# Patient Record
Sex: Female | Born: 1960 | Race: White | Hispanic: No | Smoking: Never smoker
Health system: Southern US, Community
[De-identification: ages and names within clinical notes are randomized; demographics above are authoritative.]

## PROBLEM LIST (undated history)

## (undated) DIAGNOSIS — E785 Hyperlipidemia, unspecified: Secondary | ICD-10-CM

## (undated) DIAGNOSIS — M810 Age-related osteoporosis without current pathological fracture: Secondary | ICD-10-CM

## (undated) DIAGNOSIS — N760 Acute vaginitis: Secondary | ICD-10-CM

## (undated) DIAGNOSIS — M1990 Unspecified osteoarthritis, unspecified site: Secondary | ICD-10-CM

## (undated) DIAGNOSIS — M542 Cervicalgia: Secondary | ICD-10-CM

## (undated) DIAGNOSIS — K5792 Diverticulitis of intestine, part unspecified, without perforation or abscess without bleeding: Secondary | ICD-10-CM

## (undated) DIAGNOSIS — I10 Essential (primary) hypertension: Secondary | ICD-10-CM

## (undated) DIAGNOSIS — M549 Dorsalgia, unspecified: Secondary | ICD-10-CM

## (undated) DIAGNOSIS — B019 Varicella without complication: Secondary | ICD-10-CM

## (undated) DIAGNOSIS — N83209 Unspecified ovarian cyst, unspecified side: Secondary | ICD-10-CM

## (undated) DIAGNOSIS — N926 Irregular menstruation, unspecified: Secondary | ICD-10-CM

## (undated) DIAGNOSIS — N390 Urinary tract infection, site not specified: Secondary | ICD-10-CM

## (undated) DIAGNOSIS — T7840XA Allergy, unspecified, initial encounter: Secondary | ICD-10-CM

## (undated) DIAGNOSIS — N200 Calculus of kidney: Secondary | ICD-10-CM

## (undated) DIAGNOSIS — K219 Gastro-esophageal reflux disease without esophagitis: Secondary | ICD-10-CM

## (undated) DIAGNOSIS — B9689 Other specified bacterial agents as the cause of diseases classified elsewhere: Secondary | ICD-10-CM

## (undated) DIAGNOSIS — U071 COVID-19: Secondary | ICD-10-CM

## (undated) DIAGNOSIS — K635 Polyp of colon: Secondary | ICD-10-CM

## (undated) DIAGNOSIS — D219 Benign neoplasm of connective and other soft tissue, unspecified: Secondary | ICD-10-CM

## (undated) HISTORY — DX: Hyperlipidemia, unspecified: E78.5

## (undated) HISTORY — PX: COLONOSCOPY W/ POLYPECTOMY: SHX1380

## (undated) HISTORY — PX: OTHER SURGICAL HISTORY: SHX169

## (undated) HISTORY — DX: Unspecified osteoarthritis, unspecified site: M19.90

## (undated) HISTORY — PX: COLONOSCOPY: SHX174

## (undated) HISTORY — PX: APPENDECTOMY: SHX54

## (undated) HISTORY — DX: Cervicalgia: M54.2

## (undated) HISTORY — DX: Irregular menstruation, unspecified: N92.6

## (undated) HISTORY — DX: Unspecified ovarian cyst, unspecified side: N83.209

## (undated) HISTORY — PX: WISDOM TOOTH EXTRACTION: SHX21

## (undated) HISTORY — PX: TONSILLECTOMY: SUR1361

## (undated) HISTORY — PX: DILATION AND CURETTAGE OF UTERUS: SHX78

## (undated) HISTORY — DX: Essential (primary) hypertension: I10

## (undated) HISTORY — PX: BREAST SURGERY: SHX581

## (undated) HISTORY — DX: Other specified bacterial agents as the cause of diseases classified elsewhere: N76.0

## (undated) HISTORY — DX: Dorsalgia, unspecified: M54.9

## (undated) HISTORY — DX: Benign neoplasm of connective and other soft tissue, unspecified: D21.9

## (undated) HISTORY — DX: Other specified bacterial agents as the cause of diseases classified elsewhere: B96.89

## (undated) HISTORY — DX: Diverticulitis of intestine, part unspecified, without perforation or abscess without bleeding: K57.92

## (undated) HISTORY — DX: Calculus of kidney: N20.0

## (undated) HISTORY — DX: Gastro-esophageal reflux disease without esophagitis: K21.9

## (undated) HISTORY — DX: Polyp of colon: K63.5

## (undated) HISTORY — DX: Urinary tract infection, site not specified: N39.0

## (undated) HISTORY — DX: COVID-19: U07.1

## (undated) HISTORY — DX: Varicella without complication: B01.9

## (undated) HISTORY — DX: Allergy, unspecified, initial encounter: T78.40XA

## (undated) HISTORY — DX: Age-related osteoporosis without current pathological fracture: M81.0

---

## 2013-07-30 ENCOUNTER — Emergency Department: Payer: Self-pay | Admitting: Emergency Medicine

## 2013-07-30 LAB — CBC WITH DIFFERENTIAL/PLATELET
Basophil #: 0.1 10*3/uL (ref 0.0–0.1)
Basophil %: 1.4 %
Eosinophil #: 0.2 10*3/uL (ref 0.0–0.7)
Eosinophil %: 3.1 %
HCT: 46.1 % (ref 35.0–47.0)
HGB: 15.2 g/dL (ref 12.0–16.0)
Lymphocyte #: 1.7 10*3/uL (ref 1.0–3.6)
Lymphocyte %: 32.5 %
MCH: 31.2 pg (ref 26.0–34.0)
MCHC: 33 g/dL (ref 32.0–36.0)
MCV: 95 fL (ref 80–100)
Monocyte #: 0.7 x10 3/mm (ref 0.2–0.9)
Monocyte %: 14 %
Neutrophil #: 2.6 10*3/uL (ref 1.4–6.5)
Neutrophil %: 49 %
Platelet: 293 10*3/uL (ref 150–440)
RBC: 4.86 10*6/uL (ref 3.80–5.20)
RDW: 13.1 % (ref 11.5–14.5)
WBC: 5.2 10*3/uL (ref 3.6–11.0)

## 2013-07-30 LAB — URINALYSIS, COMPLETE
BACTERIA: NONE SEEN
BILIRUBIN, UR: NEGATIVE
Glucose,UR: NEGATIVE mg/dL (ref 0–75)
KETONE: NEGATIVE
Leukocyte Esterase: NEGATIVE
Nitrite: NEGATIVE
PROTEIN: NEGATIVE
Ph: 7 (ref 4.5–8.0)
SPECIFIC GRAVITY: 1.009 (ref 1.003–1.030)
WBC UR: <1 /HPF (ref 0–5)

## 2013-07-30 LAB — BASIC METABOLIC PANEL
Anion Gap: 6 — ABNORMAL LOW (ref 7–16)
BUN: 18 mg/dL (ref 7–18)
Calcium, Total: 9.2 mg/dL (ref 8.5–10.1)
Chloride: 104 mmol/L (ref 98–107)
Co2: 28 mmol/L (ref 21–32)
Creatinine: 0.56 mg/dL — ABNORMAL LOW (ref 0.60–1.30)
EGFR (African American): >60
EGFR (Non-African Amer.): >60
Glucose: 92 mg/dL (ref 65–99)
Osmolality: 277 (ref 275–301)
Potassium: 3.9 mmol/L (ref 3.5–5.1)
Sodium: 138 mmol/L (ref 136–145)

## 2013-07-30 LAB — TSH: Thyroid Stimulating Horm: 7.67 u[IU]/mL — ABNORMAL HIGH

## 2013-07-30 LAB — PRO B NATRIURETIC PEPTIDE: B-Type Natriuretic Peptide: 67 pg/mL (ref 0–125)

## 2013-07-30 LAB — TROPONIN I: Troponin-I: <0.02 ng/mL

## 2013-12-20 LAB — HM HEPATITIS C SCREENING LAB: HM Hepatitis Screen: NEGATIVE

## 2014-10-08 ENCOUNTER — Ambulatory Visit (INDEPENDENT_AMBULATORY_CARE_PROVIDER_SITE_OTHER): Payer: Managed Care, Other (non HMO) | Admitting: Family

## 2014-10-08 ENCOUNTER — Encounter: Payer: Self-pay | Admitting: Family

## 2014-10-08 ENCOUNTER — Other Ambulatory Visit (INDEPENDENT_AMBULATORY_CARE_PROVIDER_SITE_OTHER): Payer: Managed Care, Other (non HMO)

## 2014-10-08 ENCOUNTER — Ambulatory Visit (INDEPENDENT_AMBULATORY_CARE_PROVIDER_SITE_OTHER)
Admission: RE | Admit: 2014-10-08 | Discharge: 2014-10-08 | Disposition: A | Discharge status: Home / Self Care | Payer: Managed Care, Other (non HMO) | Source: Ambulatory Visit | Attending: Family | Admitting: Family

## 2014-10-08 VITALS — BP 144/104 | HR 56 | Temp 98.0°F | Resp 18 | Ht 63.0 in | Wt 121.0 lb

## 2014-10-08 DIAGNOSIS — I10 Essential (primary) hypertension: Secondary | ICD-10-CM

## 2014-10-08 DIAGNOSIS — M79672 Pain in left foot: Secondary | ICD-10-CM

## 2014-10-08 LAB — COMPREHENSIVE METABOLIC PANEL
ALBUMIN: 4.5 g/dL (ref 3.5–5.2)
ALT: 19 U/L (ref 0–35)
AST: 20 U/L (ref 0–37)
Alkaline Phosphatase: 68 U/L (ref 39–117)
BUN: 15 mg/dL (ref 6–23)
CALCIUM: 9.6 mg/dL (ref 8.4–10.5)
CHLORIDE: 103 meq/L (ref 96–112)
CO2: 28 meq/L (ref 19–32)
Creatinine, Ser: 0.68 mg/dL (ref 0.40–1.20)
GFR: 96.33 mL/min (ref >60.00)
Glucose, Bld: 90 mg/dL (ref 70–99)
Potassium: 4.1 mEq/L (ref 3.5–5.1)
Sodium: 138 mEq/L (ref 135–145)
Total Bilirubin: 0.7 mg/dL (ref 0.2–1.2)
Total Protein: 7.1 g/dL (ref 6.0–8.3)

## 2014-10-08 LAB — HEMOGLOBIN A1C: HEMOGLOBIN A1C: 5.3 % (ref 4.6–6.5)

## 2014-10-08 MED ORDER — HYDROCHLOROTHIAZIDE 12.5 MG PO CAPS: 12.5000 mg | ORAL_CAPSULE | Freq: Every day | ORAL | Status: DC

## 2014-10-08 NOTE — Assessment & Plan Note (Addendum)
Left foot pain secondary to possible pes cavus and cannot rule out Morton's neuroma. Obtain x-ray of foot. Continue to wear supportive shoe. Follow up with sports medicine for further imaging and possible orthotics.

## 2014-10-08 NOTE — Progress Notes (Signed)
Pre visit review using our clinic review tool, if applicable. No additional management support is needed unless otherwise documented below in the visit note. 

## 2014-10-08 NOTE — Assessment & Plan Note (Addendum)
Previous history of hypertension well controlled with lifestyle management. Recently increased above goal 140/90 despite lifestyle changes. Start hydrochlorothiazide. Continue to monitor blood pressure at home is able. Obtain complete metabolic panel and hemoglobin A1c. Follow-up in one month to determine effectiveness.

## 2014-10-08 NOTE — Progress Notes (Signed)
Subjective:    Patient ID: Kathy Riley, female    DOB: 05-17-1960, 54 y.o.   MRN: [id number]  Chief Complaint  Patient presents with  . Establish Care    has issues with high BP and swelling in her right ankle that does not seem to go down, leg tightness    HPI:  Kathy Riley is a 54 y.o. female with a PMH of hypertension who presents today for an office visit to establish care.     1.) Hypertension - Previously diagnosed with hypertension and maintained on medication for about 2 years, and then has managed with lifestyle behaviors including diet and exercise. Has recently noted in the past month an increase in her blood pressure again.   BP Readings from Last 3 Encounters:  10/08/14 144/104    2.) Lower extremity edema / foot pain- Associated symptom of edema and pain located primarily in her left leg and ankle has been going on for about a year. Notes that there is swelling that is worse during the day and improves overnight. Elevation does help occasionally. Is currently physically active about 1-1.5 hours per day.    Allergies  Allergen Reactions  . Penicillins     Fever, happened as a child     No outpatient prescriptions prior to visit.   No facility-administered medications prior to visit.    Past Medical History  Diagnosis Date  . Diverticulitis   . Allergy   . Hypertension   . Colon polyps   . Chicken pox   . Kidney stones   . UTI (urinary tract infection)      Past Surgical History  Procedure Laterality Date  . Appendectomy    . Tonsillectomy    . Breast surgery       Family History  Problem Relation Age of Onset  . Hypertension Mother   . Alcohol abuse Father   . Hypertension Father   . Stroke Maternal Grandmother   . Hypertension Maternal Grandmother   . Hypertension Maternal Grandfather   . Hypertension Paternal Grandmother   . Stroke Paternal Grandfather   . Hypertension Paternal Grandfather      History   Social  History  . Marital Status: Divorced    Spouse Name: N/A  . Number of Children: 0  . Years of Education: 16   Occupational History  . [place] History Main Topics  . Smoking status: Never Smoker   . Smokeless tobacco: Never Used  . Alcohol Use: 0.0 oz/week    0 Standard drinks or equivalent per week     Comment: occasional  . Drug Use: No  . Sexual Activity: Yes    Birth Control/ Protection: None   Other Topics Concern  . Not on file   Social History Narrative   Fun: Relax, swim   Denies religious beliefs effecting health care.   Denies abuse and feels safe at home.      Review of Systems  Eyes:       Positive for mild changes in vision.    Respiratory: Negative for cough, chest tightness and shortness of breath.   Cardiovascular: Positive for leg swelling. Negative for chest pain and palpitations.  Musculoskeletal: Positive for neck stiffness.  Neurological: Positive for dizziness. Negative for headaches.      Objective:    BP 144/104 mmHg  Pulse 56  Temp(Src) 98 F (36.7 C) (Oral)  Resp 18  Ht 5\' 3"  (1.6 m)  Wt 121  lb (54.885 kg)  BMI 21.44 kg/m2  SpO2 99% Nursing note and vital signs reviewed.  Physical Exam  Constitutional: She is oriented to person, place, and time. She appears well-developed and well-nourished. No distress.  Cardiovascular: Normal rate, regular rhythm, normal heart sounds and intact distal pulses.   Pulmonary/Chest: Effort normal and breath sounds normal.  Musculoskeletal:  No obvious deformity or discoloration noted. Bilateral feet with pes cavus. Palpable tenderness along second, third, and fourth metatarsals. Most point tender between second and third metatarsals. Relieved with splaying of the toes. Mild tenderness along ankle mortise. Full range of motion noted. Ligamentous tests are negative. Distal pulses are intact and appropriate.  Neurological: She is alert and oriented to person, place, and time.  Skin: Skin is  warm and dry.  Psychiatric: She has a normal mood and affect. Her behavior is normal. Judgment and thought content normal.       Assessment & Plan:   Problem List Items Addressed This Visit      Cardiovascular and Mediastinum   Essential hypertension - Primary    Previous history of hypertension well controlled with lifestyle management. Recently increased above goal 140/90 despite lifestyle changes. Start hydrochlorothiazide. Continue to monitor blood pressure at home is able. Obtain complete metabolic panel and hemoglobin A1c. Follow-up in one month to determine effectiveness.      Relevant Medications   hydrochlorothiazide (MICROZIDE) 12.5 MG capsule   Other Relevant Orders   Hemoglobin A1c (Completed)   Comprehensive metabolic panel (Completed)     Other   Left foot pain    Left foot pain secondary to possible pes cavus and cannot rule out Morton's neuroma. Obtain x-ray of foot. Continue to wear supportive shoe. Follow up with sports medicine for further imaging and possible orthotics.       Relevant Orders   DG Foot Complete Left   AMB referral to sports medicine

## 2014-10-08 NOTE — Patient Instructions (Signed)
Thank you for choosing [facility].  Summary/Instructions:  Your prescription(s) have been submitted to your pharmacy or been printed and provided for you. Please take as directed and contact our office if you believe you are having problem(s) with the medication(s) or have any questions.  Please stop by the lab on the basement level of the building for your blood work. Your results will be released to [place] (or called to you) after review, usually within 72 hours after test completion. If any changes need to be made, you will be notified at that same time.  Please stop by radiology on the basement level of the building for your x-rays. Your results will be released to [place] (or called to you) after review, usually within 72 hours after test completion. If any treatments or changes are necessary, you will be notified at that same time.  Referrals have been made during this visit. You should expect to hear back from our schedulers in about 7-10 days in regards to establishing an appointment with the specialists we discussed.   If your symptoms worsen or fail to improve, please contact our office for further instruction, or in case of emergency go directly to the emergency room at the closest medical facility.   Hypertension Hypertension, commonly called high blood pressure, is when the force of blood pumping through your arteries is too strong. Your arteries are the blood vessels that carry blood from your heart throughout your body. A blood pressure reading consists of a higher number over a lower number, such as 110/72. The higher number (systolic) is the pressure inside your arteries when your heart pumps. The lower number (diastolic) is the pressure inside your arteries when your heart relaxes. Ideally you want your blood pressure below 120/80. Hypertension forces your heart to work harder to pump blood. Your arteries may become narrow or stiff. Having hypertension puts you at risk for  heart disease, stroke, and other problems.  RISK FACTORS Some risk factors for high blood pressure are controllable. Others are not.  Risk factors you cannot control include:   Race. You may be at higher risk if you are African American.  Age. Risk increases with age.  Gender. Men are at higher risk than women before age 73 years. After age 43, women are at higher risk than men. Risk factors you can control include:  Not getting enough exercise or physical activity.  Being overweight.  Getting too much fat, sugar, calories, or salt in your diet.  Drinking too much alcohol. SIGNS AND SYMPTOMS Hypertension does not usually cause signs or symptoms. Extremely high blood pressure (hypertensive crisis) may cause headache, anxiety, shortness of breath, and nosebleed. DIAGNOSIS  To check if you have hypertension, your health care provider will measure your blood pressure while you are seated, with your arm held at the level of your heart. It should be measured at least twice using the same arm. Certain conditions can cause a difference in blood pressure between your right and left arms. A blood pressure reading that is higher than normal on one occasion does not mean that you need treatment. If one blood pressure reading is high, ask your health care provider about having it checked again. TREATMENT  Treating high blood pressure includes making lifestyle changes and possibly taking medicine. Living a healthy lifestyle can help lower high blood pressure. You may need to change some of your habits. Lifestyle changes may include:  Following the DASH diet. This diet is high in fruits, vegetables,  and whole grains. It is low in salt, red meat, and added sugars.  Getting at least 2 hours of brisk physical activity every week.  Losing weight if necessary.  Not smoking.  Limiting alcoholic beverages.  Learning ways to reduce stress. If lifestyle changes are not enough to get your blood  pressure under control, your health care provider may prescribe medicine. You may need to take more than one. Work closely with your health care provider to understand the risks and benefits. HOME CARE INSTRUCTIONS  Have your blood pressure rechecked as directed by your health care provider.   Take medicines only as directed by your health care provider. Follow the directions carefully. Blood pressure medicines must be taken as prescribed. The medicine does not work as well when you skip doses. Skipping doses also puts you at risk for problems.   Do not smoke.   Monitor your blood pressure at home as directed by your health care provider. SEEK MEDICAL CARE IF:   You think you are having a reaction to medicines taken.  You have recurrent headaches or feel dizzy.  You have swelling in your ankles.  You have trouble with your vision. SEEK IMMEDIATE MEDICAL CARE IF:  You develop a severe headache or confusion.  You have unusual weakness, numbness, or feel faint.  You have severe chest or abdominal pain.  You vomit repeatedly.  You have trouble breathing. MAKE SURE YOU:   Understand these instructions.  Will watch your condition.  Will get help right away if you are not doing well or get worse. Document Released: 03/08/2005 Document Revised: 07/23/2013 Document Reviewed: 12/29/2012 [facility] Patient Information 2015 [place], [place]. This information is not intended to replace advice given to you by your health care provider. Make sure you discuss any questions you have with your health care provider.

## 2014-10-09 ENCOUNTER — Telehealth: Payer: Self-pay | Admitting: Family

## 2014-10-09 NOTE — Telephone Encounter (Signed)
Tried calling pt and was unavailable. Will try to call back later.

## 2014-10-09 NOTE — Telephone Encounter (Signed)
Please inform patient that her kidney function, liver function, and electrolytes are all within the normal ranges. Her A1c was normal and she does not have diabetes or pre-diabetes. Her x-rays did not show any evidence of fracture. Therefore please continue with the plan that we have established to follow up with sports medicine.

## 2014-10-14 NOTE — Telephone Encounter (Signed)
LVM for pt to call back.

## 2014-10-15 NOTE — Telephone Encounter (Signed)
Pt aware of results 

## 2014-11-04 ENCOUNTER — Encounter: Payer: Self-pay | Admitting: Family Medicine

## 2014-11-04 ENCOUNTER — Other Ambulatory Visit (INDEPENDENT_AMBULATORY_CARE_PROVIDER_SITE_OTHER): Payer: Managed Care, Other (non HMO)

## 2014-11-04 ENCOUNTER — Ambulatory Visit (INDEPENDENT_AMBULATORY_CARE_PROVIDER_SITE_OTHER): Payer: Managed Care, Other (non HMO) | Admitting: Family Medicine

## 2014-11-04 VITALS — BP 120/82 | HR 66 | Ht 63.0 in | Wt 122.0 lb

## 2014-11-04 DIAGNOSIS — M7752 Other enthesopathy of left foot: Secondary | ICD-10-CM

## 2014-11-04 DIAGNOSIS — M7750 Other enthesopathy of unspecified foot: Secondary | ICD-10-CM | POA: Insufficient documentation

## 2014-11-04 DIAGNOSIS — M71572 Other bursitis, not elsewhere classified, left ankle and foot: Secondary | ICD-10-CM | POA: Diagnosis not present

## 2014-11-04 DIAGNOSIS — M79672 Pain in left foot: Secondary | ICD-10-CM

## 2014-11-04 DIAGNOSIS — M216X2 Other acquired deformities of left foot: Secondary | ICD-10-CM | POA: Insufficient documentation

## 2014-11-04 HISTORY — DX: Other enthesopathy of unspecified foot and ankle: M77.50

## 2014-11-04 MED ORDER — PREDNISONE 50 MG PO TABS: 50.0000 mg | ORAL_TABLET | Freq: Every day | ORAL | Status: DC

## 2014-11-04 NOTE — Patient Instructions (Signed)
Good to see you.  Ice 20 minutes 2 times daily. Usually after activity and before bed. Exercises 3 times a week start in 3 days pennsaid pinkie amount topically 2 times daily as needed.  Prednisone daily for 5 days.  Good shoes with rigid bottom.  Jalene Mullet, [place] or New balance greater then 700 Avoid being barefoot Avoid walking or running on treadmill for now.  See me again in 2-3 weeks and see how you are doing.

## 2014-11-04 NOTE — Progress Notes (Signed)
Pre visit review using our clinic review tool, if applicable. No additional management support is needed unless otherwise documented below in the visit note. 

## 2014-11-04 NOTE — Assessment & Plan Note (Signed)
Patient's left foot pain has multiple problems. Patient does have intermetatarsal bursitis is likely causing the majority of her pain as well as a Morton's neuroma secondary to the breakdown of the transverse arch. We discussed icing regimen, proper shoe wear, home exercises, and the possibility of custom orthotics. We discussed with patient to avoid certain activities. Patient will try to do this as well as given a trial of topical anti-inflammatory. Patient come back and see me again in 2-3 weeks. At that time if the intermetatarsal bursitis is better we may consider a neuroma injection. We'll discuss at follow-up.

## 2014-11-04 NOTE — Progress Notes (Signed)
[facility] [address], [place] [address] Phone: [phone] Subjective:    I'm seeing this patient by the request  of:  Mauricio Po, FNP   CC: Foot pain left  CXK:[id number] Kathy Riley is a 54 y.o. female coming in with complaint of foot pain. States that it is left-sided. Has had this pain for quite some time. Patient was seen by primary care provider and x-rays are taken. Patient did have mild to moderate osteophytic changes of the first metatarsal joint. This was reviewed by me today. Patient states this pain seems to be associated with lower extremity edema as well. Patient states that the swelling seems to be worse throughout the day and then better overnight patient attempts to work on a regular basis most days a week.. Mostly ball of foot. States that she can only wear certain shoes. States that the pain can even to prep at night. Rates the severity of pain a 7 out of 10. Starting to stop her from some activities. Continues to work out regularly though.   Past Medical History  Diagnosis Date  . Diverticulitis   . Allergy   . Hypertension   . Colon polyps   . Chicken pox   . Kidney stones   . UTI (urinary tract infection)    Past Surgical History  Procedure Laterality Date  . Appendectomy    . Tonsillectomy    . Breast surgery     Social History  Substance Use Topics  . Smoking status: Never Smoker   . Smokeless tobacco: Never Used  . Alcohol Use: 0.0 oz/week    0 Standard drinks or equivalent per week     Comment: occasional   Allergies  Allergen Reactions  . Penicillins     Fever, happened as a child    Family History  Problem Relation Age of Onset  . Hypertension Mother   . Alcohol abuse Father   . Hypertension Father   . Stroke Maternal Grandmother   . Hypertension Maternal Grandmother   . Hypertension Maternal Grandfather   . Hypertension Paternal Grandmother   . Stroke Paternal Grandfather   .  Hypertension Paternal Grandfather        Past medical history, social, surgical and family history all reviewed in electronic medical record.   Review of Systems: No headache, visual changes, nausea, vomiting, diarrhea, constipation, dizziness, abdominal pain, skin rash, fevers, chills, night sweats, weight loss, swollen lymph nodes, body aches, joint swelling, muscle aches, chest pain, shortness of breath, mood changes.   Objective Blood pressure 120/82, pulse 66, height 5\' 3"  (1.6 m), weight 122 lb (55.339 kg), SpO2 99 %.  General: No apparent distress alert and oriented x3 mood and affect normal, dressed appropriately.  HEENT: Pupils equal, extraocular movements intact  Respiratory: Patient's speak in full sentences and does not appear short of breath  Cardiovascular: No lower extremity edema, non tender, no erythema  Skin: Warm dry intact with no signs of infection or rash on extremities or on axial skeleton.  Abdomen: Soft nontender  Neuro: Cranial nerves II through XII are intact, neurovascularly intact in all extremities with 2+ DTRs and 2+ pulses.  Lymph: No lymphadenopathy of posterior or anterior cervical chain or axillae bilaterally.  Gait normal with good balance and coordination.  MSK:  Non tender with full range of motion and good stability and symmetric strength and tone of shoulders, elbows, wrist, hip, knees bilaterally.  Ankle: Left No visible erythema or swelling.  Range of motion is full in all directions. Strength is 5/5 in all directions. Stable lateral and medial ligaments; squeeze test and kleiger test unremarkable; Talar dome nontender; No pain at base of 5th MT; No tenderness over cuboid; No tenderness over N spot or navicular prominence No tenderness on posterior aspects of lateral and medial malleolus No sign of peroneal tendon subluxations or tenderness to palpation Negative tarsal tunnel tinel's Able to walk 4 steps.  Foot exam shows the patient though  does have severe breakdown of the transverse arch with splaying between the second and third toes. Patient also has some mild limitus and the range of motion of the first and second metatarsal joints. Patient is tender to palpation between the second and third toes. Patient is also somewhat tender on the ball of the foot. Negative compression or squeeze test. Neurovascularly intact distally.  MSK US performed of: Left foot This study was ordered, performed, and interpreted by Charlann Boxer D.O.  Foot/Ankle:   All structures visualized.   Talar dome unremarkable  Ankle mortise without effusion. Peroneus longus and brevis tendons unremarkable on long and transverse views without sheath effusions. Posterior tibialis, flexor hallucis longus, and flexor digitorum longus tendons unremarkable on long and transverse views without sheath effusions. Achilles tendon no tearing no hypoechoic changes but nodule noted 3 cm proximal to insertion. Anterior Talofibular Ligament and Calcaneofibular Ligaments unremarkable and intact. Deltoid Ligament unremarkable and intact. Patient is also have a neuroma noted between the second and third metatarsals. Significant intermetatarsal bursitis noted between all toes. Significant plantar subcutaneous edema noted on the forefoot.  IMPRESSION:  Morton's neuroma an intermetatarsal bursitis  Procedure note 65993; 15 minutes spent for Therapeutic exercises as stated in above notes.  This included exercises focusing on stretching, strengthening, with significant focus on eccentric aspects. - Exercises for the foot include:  Stretches to help lengthen the lower leg and plantar fascia areas Theraband exercises for the lower leg and ankle to help strengthen the surrounding area- dorsiflexion, plantarflexion, inversion, eversion Massage rolling on the plantar surface of the foot with a frozen bottle, tennis ball or golf ball Towel or marble pick-ups to strengthen the plantar  surface of the foot Weight bearing exercises to increase balance and overall stability  Proper technique shown and discussed handout in great detail with ATC.  All questions were discussed and answered.      Impression and Recommendations:     This case required medical decision making of moderate complexity.

## 2014-11-22 ENCOUNTER — Other Ambulatory Visit: Payer: Self-pay | Admitting: Family

## 2014-12-15 ENCOUNTER — Other Ambulatory Visit: Payer: Self-pay | Admitting: Family

## 2015-03-06 ENCOUNTER — Other Ambulatory Visit: Payer: Self-pay | Admitting: Family

## 2015-03-18 ENCOUNTER — Other Ambulatory Visit: Payer: Self-pay | Admitting: Family

## 2015-04-09 ENCOUNTER — Other Ambulatory Visit: Payer: Self-pay | Admitting: Family

## 2015-05-06 ENCOUNTER — Encounter: Payer: Self-pay | Admitting: Family

## 2015-05-06 ENCOUNTER — Ambulatory Visit (INDEPENDENT_AMBULATORY_CARE_PROVIDER_SITE_OTHER): Payer: No Typology Code available for payment source | Admitting: Family

## 2015-05-06 VITALS — BP 128/82 | HR 70 | Temp 98.2°F | Resp 18 | Ht 63.0 in | Wt 126.8 lb

## 2015-05-06 DIAGNOSIS — G8929 Other chronic pain: Secondary | ICD-10-CM | POA: Insufficient documentation

## 2015-05-06 DIAGNOSIS — J309 Allergic rhinitis, unspecified: Secondary | ICD-10-CM | POA: Diagnosis not present

## 2015-05-06 DIAGNOSIS — M542 Cervicalgia: Secondary | ICD-10-CM | POA: Diagnosis not present

## 2015-05-06 MED ORDER — PREDNISONE 10 MG (21) PO TBPK: ORAL_TABLET | ORAL | Status: DC

## 2015-05-06 MED ORDER — TIZANIDINE HCL 4 MG PO TABS: 4.0000 mg | ORAL_TABLET | Freq: Four times a day (QID) | ORAL | Status: DC | PRN

## 2015-05-06 MED ORDER — HYDROCHLOROTHIAZIDE 12.5 MG PO CAPS: ORAL_CAPSULE | ORAL | Status: DC

## 2015-05-06 NOTE — Progress Notes (Signed)
Subjective:    Patient ID: Kathy Riley, female    DOB: Aug 06, 1960, 55 y.o.   MRN: [id number]  Chief Complaint  Patient presents with  . Joint Pain    consistent soreness in her arms, wakes up really sore like she has worked out, Patent attorney, also sinus related issues x2 weeks itchy eyes, drainage    HPI:  Kathy Riley is a 55 y.o. female who  has a past medical history of Diverticulitis; Allergy; Hypertension; Colon polyps; Chicken pox; Kidney stones; and UTI (urinary tract infection). and presents today for an acute office visit.  1.) Neck pain - this is a new problem. Associated symptoms of soreness located in her neck and arms has been going on for approximately. Describes waking up really sore like she has worked out. Modifying factors include naproxen which did help with her symptoms. Timing of symptoms is worse in the morning. Heat packs has not helped with her symptoms. Has not worked out in the past 2 weeks. No trauma. No numbness or tingling. Does improve some with movement.   2.) Sinus issues - associated symptom of itchy eyes, drainage and productive cough has been going on for about 2 weeks. Denies fevers. Modifying factors include antihistimines which caused things to drain more. Zyrtec tends to help. No recent antibiotic use. She is in a new environment for work. Nasal discharge clear and runny.  Allergies  Allergen Reactions  . Penicillins     Fever, happened as a child      Current Outpatient Prescriptions on File Prior to Visit  Medication Sig Dispense Refill  . naproxen (NAPROSYN) 500 MG tablet TAKE 1 TABLET BY MOUTH 2 TIMES DAILY AS NEEDED FOR PAIN. TAKE WITH FOOD/MILK TO REDUCE GI UPSET. 30 tablet 0   No current facility-administered medications on file prior to visit.    Past Medical History  Diagnosis Date  . Diverticulitis   . Allergy   . Hypertension   . Colon polyps   . Chicken pox   . Kidney stones   . UTI (urinary tract infection)      Past Surgical History  Procedure Laterality Date  . Appendectomy    . Tonsillectomy    . Breast surgery       Review of Systems  Constitutional: Negative for fever and chills.  HENT: Positive for congestion, rhinorrhea and sneezing.   Eyes: Positive for itching.  Respiratory: Positive for cough. Negative for chest tightness and shortness of breath.   Cardiovascular: Negative for chest pain, palpitations and leg swelling.  Musculoskeletal: Positive for neck pain and neck stiffness.  Neurological: Negative for weakness and numbness.      Objective:    BP 128/82 mmHg  Pulse 70  Temp(Src) 98.2 F (36.8 C) (Oral)  Resp 18  Ht 5\' 3"  (1.6 m)  Wt 126 lb 12.8 oz (57.516 kg)  BMI 22.47 kg/m2  SpO2 99% Nursing note and vital signs reviewed.  Physical Exam  Constitutional: She is oriented to person, place, and time. She appears well-developed and well-nourished. No distress.  HENT:  Right Ear: Hearing, tympanic membrane, external ear and ear canal normal.  Left Ear: Hearing, tympanic membrane, external ear and ear canal normal.  Nose: Nose normal.  Mouth/Throat: Uvula is midline, oropharynx is clear and moist and mucous membranes are normal.  Neck:  No obvious deformity, discoloration, or edema. Tenderness elicited along cervical spine and paraspinal musculature bilaterally. Mild to moderate muscle spasm of cervical spine musculature noted. Range of  motion restricted and lateral bending and rotation. Upper extremity range of motion intact and appropriate. Strength is normal. Cervical compression is negative. Distal pulses and sensation are intact and appropriate.  Cardiovascular: Normal rate, regular rhythm, normal heart sounds and intact distal pulses.   Pulmonary/Chest: Effort normal and breath sounds normal.  Neurological: She is alert and oriented to person, place, and time.  Skin: Skin is warm and dry.  Psychiatric: She has a normal mood and affect. Her behavior is normal.  Judgment and thought content normal.       Assessment & Plan:   Problem List Items Addressed This Visit      Respiratory   Allergic rhinitis    Symptoms and exam consistent with allergic rhinitis. Start prednisone. Continue over-the-counter medications as needed for symptom relief and supportive care. Follow-up if symptoms worsen or fail to improve.      Relevant Medications   predniSONE (STERAPRED UNI-PAK 21 TAB) 10 MG (21) TBPK tablet     Other   Neck pain - Primary    Symptoms of neck pain with concern for possible cervical disc pathology or stenosis. Home exercise therapy initiated. Continue current dosage of naproxen as needed for discomfort. Start Zanaflex as needed for muscle spasm. Recommend ice/heat multiple times throughout the day as needed. Obtain x-rays from chiropractor in [place]. May require further advanced imaging if symptoms worsen or do not improve. Follow-up in 3 weeks.      Relevant Medications   predniSONE (STERAPRED UNI-PAK 21 TAB) 10 MG (21) TBPK tablet   tiZANidine (ZANAFLEX) 4 MG tablet

## 2015-05-06 NOTE — Assessment & Plan Note (Signed)
Symptoms of neck pain with concern for possible cervical disc pathology or stenosis. Home exercise therapy initiated. Continue current dosage of naproxen as needed for discomfort. Start Zanaflex as needed for muscle spasm. Recommend ice/heat multiple times throughout the day as needed. Obtain x-rays from chiropractor in [place]. May require further advanced imaging if symptoms worsen or do not improve. Follow-up in 3 weeks.

## 2015-05-06 NOTE — Progress Notes (Signed)
Pre visit review using our clinic review tool, if applicable. No additional management support is needed unless otherwise documented below in the visit note. 

## 2015-05-06 NOTE — Patient Instructions (Addendum)
Thank you for choosing [facility].  Summary/Instructions:  Your prescription(s) have been submitted to your pharmacy or been printed and provided for you. Please take as directed and contact our office if you believe you are having problem(s) with the medication(s) or have any questions.  If your symptoms worsen or fail to improve, please contact our office for further instruction, or in case of emergency go directly to the emergency room at the closest medical facility.   Cervical Strain and Sprain With Rehab Cervical strain and sprain are injuries that commonly occur with "whiplash" injuries. Whiplash occurs when the neck is forcefully whipped backward or forward, such as during a motor vehicle accident or during contact sports. The muscles, ligaments, tendons, discs, and nerves of the neck are susceptible to injury when this occurs. RISK FACTORS Risk of having a whiplash injury increases if:  Osteoarthritis of the spine.  Situations that make head or neck accidents or trauma more likely.  High-risk sports (football, rugby, wrestling, hockey, auto racing, gymnastics, diving, contact karate, or boxing).  Poor strength and flexibility of the neck.  Previous neck injury.  Poor tackling technique.  Improperly fitted or padded equipment. SYMPTOMS   Pain or stiffness in the front or back of neck or both.  Symptoms may present immediately or up to 24 hours after injury.  Dizziness, headache, nausea, and vomiting.  Muscle spasm with soreness and stiffness in the neck.  Tenderness and swelling at the injury site. PREVENTION  Learn and use proper technique (avoid tackling with the head, spearing, and head-butting; use proper falling techniques to avoid landing on the head).  Warm up and stretch properly before activity.  Maintain physical fitness:  Strength, flexibility, and endurance.  Cardiovascular fitness.  Wear properly fitted and padded protective equipment,  such as padded soft collars, for participation in contact sports. PROGNOSIS  Recovery from cervical strain and sprain injuries is dependent on the extent of the injury. These injuries are usually curable in 1 week to 3 months with appropriate treatment.  RELATED COMPLICATIONS   Temporary numbness and weakness may occur if the nerve roots are damaged, and this may persist until the nerve has completely healed.  Chronic pain due to frequent recurrence of symptoms.  Prolonged healing, especially if activity is resumed too soon (before complete recovery). TREATMENT  Treatment initially involves the use of ice and medication to help reduce pain and inflammation. It is also important to perform strengthening and stretching exercises and modify activities that worsen symptoms so the injury does not get worse. These exercises may be performed at home or with a therapist. For patients who experience severe symptoms, a soft, padded collar may be recommended to be worn around the neck.  Improving your posture may help reduce symptoms. Posture improvement includes pulling your chin and abdomen in while sitting or standing. If you are sitting, sit in a firm chair with your buttocks against the back of the chair. While sleeping, try replacing your pillow with a small towel rolled to 2 inches in diameter, or use a cervical pillow or soft cervical collar. Poor sleeping positions delay healing.  For patients with nerve root damage, which causes numbness or weakness, the use of a cervical traction apparatus may be recommended. Surgery is rarely necessary for these injuries. However, cervical strain and sprains that are present at birth (congenital) may require surgery. MEDICATION   If pain medication is necessary, nonsteroidal anti-inflammatory medications, such as aspirin and ibuprofen, or other minor pain relievers, such  as acetaminophen, are often recommended.  Do not take pain medication for 7 days before  surgery.  Prescription pain relievers may be given if deemed necessary by your caregiver. Use only as directed and only as much as you need. HEAT AND COLD:   Cold treatment (icing) relieves pain and reduces inflammation. Cold treatment should be applied for 10 to 15 minutes every 2 to 3 hours for inflammation and pain and immediately after any activity that aggravates your symptoms. Use ice packs or an ice massage.  Heat treatment may be used prior to performing the stretching and strengthening activities prescribed by your caregiver, physical therapist, or athletic trainer. Use a heat pack or a warm soak. SEEK MEDICAL CARE IF:   Symptoms get worse or do not improve in 2 weeks despite treatment.  New, unexplained symptoms develop (drugs used in treatment may produce side effects). EXERCISES RANGE OF MOTION (ROM) AND STRETCHING EXERCISES - Cervical Strain and Sprain These exercises may help you when beginning to rehabilitate your injury. In order to successfully resolve your symptoms, you must improve your posture. These exercises are designed to help reduce the forward-head and rounded-shoulder posture which contributes to this condition. Your symptoms may resolve with or without further involvement from your physician, physical therapist or athletic trainer. While completing these exercises, remember:   Restoring tissue flexibility helps normal motion to return to the joints. This allows healthier, less painful movement and activity.  An effective stretch should be held for at least 20 seconds, although you may need to begin with shorter hold times for comfort.  A stretch should never be painful. You should only feel a gentle lengthening or release in the stretched tissue. STRETCH- Axial Extensors  Lie on your back on the floor. You may bend your knees for comfort. Place a rolled-up hand towel or dish towel, about 2 inches in diameter, under the part of your head that makes contact with the  floor.  Gently tuck your chin, as if trying to make a "double chin," until you feel a gentle stretch at the base of your head.  Hold __________ seconds. Repeat __________ times. Complete this exercise __________ times per day.  STRETCH - Axial Extension   Stand or sit on a firm surface. Assume a good posture: chest up, shoulders drawn back, abdominal muscles slightly tense, knees unlocked (if standing) and feet hip width apart.  Slowly retract your chin so your head slides back and your chin slightly lowers. Continue to look straight ahead.  You should feel a gentle stretch in the back of your head. Be certain not to feel an aggressive stretch since this can cause headaches later.  Hold for __________ seconds. Repeat __________ times. Complete this exercise __________ times per day. STRETCH - Cervical Side Bend   Stand or sit on a firm surface. Assume a good posture: chest up, shoulders drawn back, abdominal muscles slightly tense, knees unlocked (if standing) and feet hip width apart.  Without letting your nose or shoulders move, slowly tip your right / left ear to your shoulder until your feel a gentle stretch in the muscles on the opposite side of your neck.  Hold __________ seconds. Repeat __________ times. Complete this exercise __________ times per day. STRETCH - Cervical Rotators   Stand or sit on a firm surface. Assume a good posture: chest up, shoulders drawn back, abdominal muscles slightly tense, knees unlocked (if standing) and feet hip width apart.  Keeping your eyes level with the ground, slowly  turn your head until you feel a gentle stretch along the back and opposite side of your neck.  Hold __________ seconds. Repeat __________ times. Complete this exercise __________ times per day. RANGE OF MOTION - Neck Circles   Stand or sit on a firm surface. Assume a good posture: chest up, shoulders drawn back, abdominal muscles slightly tense, knees unlocked (if standing) and  feet hip width apart.  Gently roll your head down and around from the back of one shoulder to the back of the other. The motion should never be forced or painful.  Repeat the motion 10-20 times, or until you feel the neck muscles relax and loosen. Repeat __________ times. Complete the exercise __________ times per day. STRENGTHENING EXERCISES - Cervical Strain and Sprain These exercises may help you when beginning to rehabilitate your injury. They may resolve your symptoms with or without further involvement from your physician, physical therapist, or athletic trainer. While completing these exercises, remember:   Muscles can gain both the endurance and the strength needed for everyday activities through controlled exercises.  Complete these exercises as instructed by your physician, physical therapist, or athletic trainer. Progress the resistance and repetitions only as guided.  You may experience muscle soreness or fatigue, but the pain or discomfort you are trying to eliminate should never worsen during these exercises. If this pain does worsen, stop and make certain you are following the directions exactly. If the pain is still present after adjustments, discontinue the exercise until you can discuss the trouble with your clinician. STRENGTH - Cervical Flexors, Isometric  Face a wall, standing about 6 inches away. Place a small pillow, a ball about 6-8 inches in diameter, or a folded towel between your forehead and the wall.  Slightly tuck your chin and gently push your forehead into the soft object. Push only with mild to moderate intensity, building up tension gradually. Keep your jaw and forehead relaxed.  Hold 10 to 20 seconds. Keep your breathing relaxed.  Release the tension slowly. Relax your neck muscles completely before you start the next repetition. Repeat __________ times. Complete this exercise __________ times per day. STRENGTH- Cervical Lateral Flexors, Isometric   Stand  about 6 inches away from a wall. Place a small pillow, a ball about 6-8 inches in diameter, or a folded towel between the side of your head and the wall.  Slightly tuck your chin and gently tilt your head into the soft object. Push only with mild to moderate intensity, building up tension gradually. Keep your jaw and forehead relaxed.  Hold 10 to 20 seconds. Keep your breathing relaxed.  Release the tension slowly. Relax your neck muscles completely before you start the next repetition. Repeat __________ times. Complete this exercise __________ times per day. STRENGTH - Cervical Extensors, Isometric   Stand about 6 inches away from a wall. Place a small pillow, a ball about 6-8 inches in diameter, or a folded towel between the back of your head and the wall.  Slightly tuck your chin and gently tilt your head back into the soft object. Push only with mild to moderate intensity, building up tension gradually. Keep your jaw and forehead relaxed.  Hold 10 to 20 seconds. Keep your breathing relaxed.  Release the tension slowly. Relax your neck muscles completely before you start the next repetition. Repeat __________ times. Complete this exercise __________ times per day. POSTURE AND BODY MECHANICS CONSIDERATIONS - Cervical Strain and Sprain Keeping correct posture when sitting, standing or completing your activities  will reduce the stress put on different body tissues, allowing injured tissues a chance to heal and limiting painful experiences. The following are general guidelines for improved posture. Your physician or physical therapist will provide you with any instructions specific to your needs. While reading these guidelines, remember:  The exercises prescribed by your provider will help you have the flexibility and strength to maintain correct postures.  The correct posture provides the optimal environment for your joints to work. All of your joints have less wear and tear when properly  supported by a spine with good posture. This means you will experience a healthier, less painful body.  Correct posture must be practiced with all of your activities, especially prolonged sitting and standing. Correct posture is as important when doing repetitive low-stress activities (typing) as it is when doing a single heavy-load activity (lifting). PROLONGED STANDING WHILE SLIGHTLY LEANING FORWARD When completing a task that requires you to lean forward while standing in one place for a long time, place either foot up on a stationary 2- to 4-inch high object to help maintain the best posture. When both feet are on the ground, the low back tends to lose its slight inward curve. If this curve flattens (or becomes too large), then the back and your other joints will experience too much stress, fatigue more quickly, and can cause pain.  RESTING POSITIONS Consider which positions are most painful for you when choosing a resting position. If you have pain with flexion-based activities (sitting, bending, stooping, squatting), choose a position that allows you to rest in a less flexed posture. You would want to avoid curling into a fetal position on your side. If your pain worsens with extension-based activities (prolonged standing, working overhead), avoid resting in an extended position such as sleeping on your stomach. Most people will find more comfort when they rest with their spine in a more neutral position, neither too rounded nor too arched. Lying on a non-sagging bed on your side with a pillow between your knees, or on your back with a pillow under your knees will often provide some relief. Keep in mind, being in any one position for a prolonged period of time, no matter how correct your posture, can still lead to stiffness. WALKING Walk with an upright posture. Your ears, shoulders, and hips should all line up. OFFICE WORK When working at a desk, create an environment that supports good, upright  posture. Without extra support, muscles fatigue and lead to excessive strain on joints and other tissues. CHAIR:  A chair should be able to slide under your desk when your back makes contact with the back of the chair. This allows you to work closely.  The chair's height should allow your eyes to be level with the upper part of your monitor and your hands to be slightly lower than your elbows.  Body position:  Your feet should make contact with the floor. If this is not possible, use a foot rest.  Keep your ears over your shoulders. This will reduce stress on your neck and low back.   This information is not intended to replace advice given to you by your health care provider. Make sure you discuss any questions you have with your health care provider.   Document Released: 03/08/2005 Document Revised: 03/29/2014 Document Reviewed: 06/20/2008 Elsevier Interactive Patient Education [employer].

## 2015-05-06 NOTE — Assessment & Plan Note (Signed)
Symptoms and exam consistent with allergic rhinitis. Start prednisone. Continue over-the-counter medications as needed for symptom relief and supportive care. Follow-up if symptoms worsen or fail to improve.

## 2015-06-14 ENCOUNTER — Other Ambulatory Visit: Payer: Self-pay | Admitting: Family

## 2015-07-07 ENCOUNTER — Ambulatory Visit (INDEPENDENT_AMBULATORY_CARE_PROVIDER_SITE_OTHER)
Admission: RE | Admit: 2015-07-07 | Discharge: 2015-07-07 | Disposition: A | Discharge status: Home / Self Care | Payer: No Typology Code available for payment source | Source: Ambulatory Visit | Attending: Family | Admitting: Family

## 2015-07-07 ENCOUNTER — Encounter: Payer: Self-pay | Admitting: Family

## 2015-07-07 ENCOUNTER — Ambulatory Visit (INDEPENDENT_AMBULATORY_CARE_PROVIDER_SITE_OTHER): Payer: No Typology Code available for payment source | Admitting: Family

## 2015-07-07 VITALS — BP 152/98 | HR 54 | Temp 98.0°F | Resp 18 | Ht 63.0 in | Wt 127.0 lb

## 2015-07-07 DIAGNOSIS — M542 Cervicalgia: Secondary | ICD-10-CM

## 2015-07-07 NOTE — Progress Notes (Signed)
Subjective:    Patient ID: Kathy Riley, female    DOB: Apr 22, 1960, 55 y.o.   MRN: [id number]  Chief Complaint  Patient presents with  . Neck Pain    states that her neck pain has gotten so much worse, it happens at any given time, shoulders hurt and it goes down into elbows, says that the pain is constant    HPI:  [name] is a 55 y.o. female who  has a past medical history of Diverticulitis; Allergy; Hypertension; Colon polyps; Chicken pox; Kidney stones; and UTI (urinary tract infection). and presents today For a follow-up office visit.  Previously evaluated in the office with concern for possible cervical disc pathology or stenosis. Treated with home exercise therapy and Zanaflex for muscle spasms. Reports taking the medication as prescribed as well as working with chiropractor which have not helped with her symptoms. Course of the symptoms have progressively worsened with radiating pain down into her elbows and a constant pain. Pain is described as sharp. No numbness, tingling, or weakness noted. Intensity of symptoms is greater on the right side. Described as her head weighing about 1,000 pounds.   Allergies  Allergen Reactions  . Penicillins     Fever, happened as a child      Current Outpatient Prescriptions on File Prior to Visit  Medication Sig Dispense Refill  . hydrochlorothiazide (MICROZIDE) 12.5 MG capsule TAKE 1 CAPSULE (12.5 MG TOTAL) BY MOUTH DAILY. 90 capsule 1  . naproxen (NAPROSYN) 500 MG tablet TAKE 1 TABLET BY MOUTH 2 TIMES DAILY AS NEEDED FOR PAIN. TAKE WITH FOOD/MILK TO REDUCE GI UPSET. 30 tablet 0   No current facility-administered medications on file prior to visit.     Review of Systems  Constitutional: Negative for fever and chills.  Musculoskeletal: Positive for neck pain and neck stiffness.  Neurological: Negative for weakness and numbness.      Objective:    BP 152/98 mmHg  Pulse 54  Temp(Src) 98 F (36.7 C) (Oral)  Resp 18   Ht 5\' 3"  (1.6 m)  Wt 127 lb (57.607 kg)  BMI 22.50 kg/m2  SpO2 99% Nursing note and vital signs reviewed.  Physical Exam  Constitutional: She is oriented to person, place, and time. She appears well-developed and well-nourished. No distress.  Neck:  No obvious deformity, discoloration, or edema noted. Palpable tenderness along cervical spinous processes and paraspinal musculature. Range of motion is severely restricted and lateral bending and rotation. Distal pulses and sensation are intact and appropriate. Positive cervical compression test.  Cardiovascular: Normal rate, regular rhythm, normal heart sounds and intact distal pulses.   Pulmonary/Chest: Effort normal and breath sounds normal.  Neurological: She is alert and oriented to person, place, and time.  Skin: Skin is warm and dry.  Psychiatric: She has a normal mood and affect. Her behavior is normal. Judgment and thought content normal.       Assessment & Plan:   Problem List Items Addressed This Visit      Other   Neck pain - Primary    Symptoms and exam remain consistent with cervical disc pathology or possible stenosis. She has failed conservative treatment and is progressively worsening. Obtain x-rays and MRI. Continue current dosage of naproxen and conservative ice/heat as needed. Discontinue Zanaflex. Patient declines additional muscle relaxers her pain medication at this time. Refer to neurosurgery for further evaluation and workup. Follow-up pending imaging results.      Relevant Orders   Ambulatory referral to Neurosurgery  MR Cervical Spine Wo Contrast   DG Cervical Spine Complete       I have discontinued Ms. Due's predniSONE and tiZANidine. I am also having her maintain her naproxen and hydrochlorothiazide.   Mauricio Po, FNP

## 2015-07-07 NOTE — Assessment & Plan Note (Signed)
Symptoms and exam remain consistent with cervical disc pathology or possible stenosis. She has failed conservative treatment and is progressively worsening. Obtain x-rays and MRI. Continue current dosage of naproxen and conservative ice/heat as needed. Discontinue Zanaflex. Patient declines additional muscle relaxers her pain medication at this time. Refer to neurosurgery for further evaluation and workup. Follow-up pending imaging results.

## 2015-07-07 NOTE — Patient Instructions (Addendum)
Thank you for choosing [facility].  Summary/Instructions:  They will call to schedule your MRI.   Please stop in the basement for x-rays.  Your prescription(s) have been submitted to your pharmacy or been printed and provided for you. Please take as directed and contact our office if you believe you are having problem(s) with the medication(s) or have any questions.  If your symptoms worsen or fail to improve, please contact our office for further instruction, or in case of emergency go directly to the emergency room at the closest medical facility.

## 2015-07-07 NOTE — Progress Notes (Signed)
Pre visit review using our clinic review tool, if applicable. No additional management support is needed unless otherwise documented below in the visit note. 

## 2015-07-08 ENCOUNTER — Telehealth: Payer: Self-pay | Admitting: Family

## 2015-07-08 NOTE — Telephone Encounter (Signed)
Please inform patient that her x-rays do show some narrowing of her cervical spine. There please continue with referral to neurosurgery as discussed.

## 2015-07-09 ENCOUNTER — Other Ambulatory Visit: Payer: No Typology Code available for payment source

## 2015-07-09 ENCOUNTER — Ambulatory Visit
Admission: RE | Admit: 2015-07-09 | Discharge: 2015-07-09 | Disposition: A | Discharge status: Home / Self Care | Payer: No Typology Code available for payment source | Source: Ambulatory Visit | Attending: Family | Admitting: Family

## 2015-07-09 DIAGNOSIS — M542 Cervicalgia: Secondary | ICD-10-CM

## 2015-07-09 NOTE — Telephone Encounter (Signed)
Pt aware of results 

## 2015-07-10 ENCOUNTER — Telehealth: Payer: Self-pay | Admitting: Family

## 2015-07-10 NOTE — Telephone Encounter (Signed)
Please inform patient that her MRI shows that she does have significant disc narrowing located at a couple of different levels of her neck which are most likely the cause of her symptoms. Please plan to follow up with neurosurgery as discussed.

## 2015-07-11 NOTE — Telephone Encounter (Signed)
Tried calling pt. No answer and VM was full. Will try back later.  

## 2015-07-14 NOTE — Telephone Encounter (Signed)
Pt aware of massage below, she wants to speak to our [facility] due to tight schedule she has. Please call her   # [phone]

## 2015-07-14 NOTE — Telephone Encounter (Signed)
I talked to pt and she will try to call the neurosurgery herself.  I did call them and had to leave message

## 2015-07-16 ENCOUNTER — Other Ambulatory Visit: Payer: Self-pay | Admitting: Family

## 2015-07-16 NOTE — Telephone Encounter (Signed)
Patient called waiting her naproxen (NAPROSYN) 500 MG tablet [id number] filled today asap she states. Please was advise that it can 24 to 48 hours.

## 2015-08-08 ENCOUNTER — Other Ambulatory Visit: Payer: Self-pay | Admitting: Family

## 2015-08-21 ENCOUNTER — Other Ambulatory Visit: Payer: Self-pay | Admitting: Family

## 2015-08-28 ENCOUNTER — Other Ambulatory Visit: Payer: Self-pay | Admitting: Family

## 2015-08-29 MED ORDER — NAPROXEN 500 MG PO TABS: ORAL_TABLET | ORAL | Status: DC

## 2015-08-29 NOTE — Addendum Note (Signed)
Addended by: Earnstine Regal on: 08/29/2015 10:14 AM   Modules accepted: Orders

## 2015-08-29 NOTE — Telephone Encounter (Signed)
1st transmission failed, resent...Kathy Riley

## 2015-10-03 ENCOUNTER — Telehealth: Payer: Self-pay

## 2015-10-03 DIAGNOSIS — Z1211 Encounter for screening for malignant neoplasm of colon: Secondary | ICD-10-CM

## 2015-10-03 NOTE — Telephone Encounter (Signed)
Referral placed.

## 2015-10-03 NOTE — Telephone Encounter (Signed)
Patient state she is in [place]. And she is in need for a refill on a medication that was done by a different doctor. I informed her to call that doctors office. But she also needs an referral for a colonoscopy? She said she will be back here in 2 weeks before she leaves out again. Please advise or follow up, Thank you!

## 2015-10-13 ENCOUNTER — Telehealth: Payer: Self-pay | Admitting: Gastroenterology

## 2015-10-13 ENCOUNTER — Telehealth: Payer: Self-pay | Admitting: Internal Medicine

## 2015-10-13 NOTE — Telephone Encounter (Signed)
Received GI records and placed on Dr. Vena Rua desk for review. Patient is requesting to see Dr. Hilarie Fredrickson.

## 2015-10-13 NOTE — Telephone Encounter (Signed)
A user error has taken place.

## 2015-10-15 ENCOUNTER — Encounter: Payer: Self-pay | Admitting: Internal Medicine

## 2015-10-15 NOTE — Telephone Encounter (Signed)
Dr. Pyrtle reviewed records and has accepted patient. Ok to schedule [facility]. Colonoscopy scheduled. °

## 2015-12-22 ENCOUNTER — Ambulatory Visit (AMBULATORY_SURGERY_CENTER): Payer: Self-pay

## 2015-12-22 VITALS — Ht 62.5 in | Wt 122.8 lb

## 2015-12-22 DIAGNOSIS — Z860100 Personal history of colon polyps, unspecified: Secondary | ICD-10-CM

## 2015-12-22 MED ORDER — SUPREP BOWEL PREP KIT 17.5-3.13-1.6 GM/177ML PO SOLN: 1.0000 | Freq: Once | ORAL | 0 refills | Status: AC

## 2015-12-22 NOTE — Progress Notes (Signed)
No allergies to eggs or soy No past problems with anesthesia No diet meds No home oxygen  Declined emmmi

## 2015-12-30 ENCOUNTER — Encounter: Payer: Self-pay | Admitting: Internal Medicine

## 2016-01-05 ENCOUNTER — Ambulatory Visit (AMBULATORY_SURGERY_CENTER): Payer: No Typology Code available for payment source | Admitting: Internal Medicine

## 2016-01-05 ENCOUNTER — Encounter: Payer: Self-pay | Admitting: Internal Medicine

## 2016-01-05 VITALS — BP 133/74 | HR 63 | Temp 97.7°F | Resp 12 | Ht 62.5 in | Wt 122.0 lb

## 2016-01-05 DIAGNOSIS — Z1211 Encounter for screening for malignant neoplasm of colon: Secondary | ICD-10-CM

## 2016-01-05 DIAGNOSIS — Z1212 Encounter for screening for malignant neoplasm of rectum: Secondary | ICD-10-CM | POA: Diagnosis not present

## 2016-01-05 DIAGNOSIS — Z800 Family history of malignant neoplasm of digestive organs: Secondary | ICD-10-CM | POA: Diagnosis not present

## 2016-01-05 MED ORDER — SODIUM CHLORIDE 0.9 % IV SOLN: 500.0000 mL | INTRAVENOUS | Status: DC

## 2016-01-05 NOTE — Patient Instructions (Signed)
Discharge instructions given. Handouts on diverticulosis and hemorrhoids. Resume previous medications. YOU HAD AN ENDOSCOPIC PROCEDURE TODAY AT THE  ENDOSCOPY CENTER:   Refer to the procedure report that was given to you for any specific questions about what was found during the examination.  If the procedure report does not answer your questions, please call your gastroenterologist to clarify.  If you requested that your care partner not be given the details of your procedure findings, then the procedure report has been included in a sealed envelope for you to review at your convenience later.  YOU SHOULD EXPECT: Some feelings of bloating in the abdomen. Passage of more gas than usual.  Walking can help get rid of the air that was put into your GI tract during the procedure and reduce the bloating. If you had a lower endoscopy (such as a colonoscopy or flexible sigmoidoscopy) you may notice spotting of blood in your stool or on the toilet paper. If you underwent a bowel prep for your procedure, you may not have a normal bowel movement for a few days.  Please Note:  You might notice some irritation and congestion in your nose or some drainage.  This is from the oxygen used during your procedure.  There is no need for concern and it should clear up in a day or so.  SYMPTOMS TO REPORT IMMEDIATELY:   Following lower endoscopy (colonoscopy or flexible sigmoidoscopy):  Excessive amounts of blood in the stool  Significant tenderness or worsening of abdominal pains  Swelling of the abdomen that is new, acute  Fever of 100F or higher   For urgent or emergent issues, a gastroenterologist can be reached at any hour by calling [phone].   DIET:  We do recommend a small meal at first, but then you may proceed to your regular diet.  Drink plenty of fluids but you should avoid alcoholic beverages for 24 hours.  ACTIVITY:  You should plan to take it easy for the rest of today and you should  NOT DRIVE or use heavy machinery until tomorrow (because of the sedation medicines used during the test).    FOLLOW UP: Our staff will call the number listed on your records the next business day following your procedure to check on you and address any questions or concerns that you may have regarding the information given to you following your procedure. If we do not reach you, we will leave a message.  However, if you are feeling well and you are not experiencing any problems, there is no need to return our call.  We will assume that you have returned to your regular daily activities without incident.  If any biopsies were taken you will be contacted by phone or by letter within the next 1-3 weeks.  Please call us at [phone] if you have not heard about the biopsies in 3 weeks.    SIGNATURES/CONFIDENTIALITY: You and/or your care partner have signed paperwork which will be entered into your electronic medical record.  These signatures attest to the fact that that the information above on your After Visit Summary has been reviewed and is understood.  Full responsibility of the confidentiality of this discharge information lies with you and/or your care-partner. 

## 2016-01-05 NOTE — Op Note (Signed)
[facility] Patient Name: [name] Procedure Date: 01/05/2016 8:34 AM MRN: [id number] Endoscopist: Jerene Bears , MD Age: 55 Referring MD:  Date of Birth: 17-Oct-1960 Gender: Female Account #: [id number] Procedure:                Colonoscopy Indications:              Screening in patient at increased risk: Family                            history of 1st-degree relative with colorectal                            cancer, Last colonoscopy 5 years ago Medicines:                Monitored Anesthesia Care Procedure:                Pre-Anesthesia Assessment:                           - Prior to the procedure, a History and Physical                            was performed, and patient medications and                            allergies were reviewed. The patient's tolerance of                            previous anesthesia was also reviewed. The risks                            and benefits of the procedure and the sedation                            options and risks were discussed with the patient.                            All questions were answered, and informed consent                            was obtained. Prior Anticoagulants: The patient has                            taken no previous anticoagulant or antiplatelet                            agents. ASA Grade Assessment: II - A patient with                            mild systemic disease. After reviewing the risks                            and benefits, the patient was deemed in  satisfactory condition to undergo the procedure.                           After obtaining informed consent, the colonoscope                            was passed under direct vision. Throughout the                            procedure, the patient's blood pressure, pulse, and                            oxygen saturations were monitored continuously. The                            Model PCF-[id number]  [id number]) scope was introduced                            through the anus and advanced to the the cecum,                            identified by appendiceal orifice and ileocecal                            valve. The colonoscopy was performed without                            difficulty. The patient tolerated the procedure                            well. The quality of the bowel preparation was                            excellent. The terminal ileum, ileocecal valve,                            appendiceal orifice, and rectum were photographed.                            The bowel preparation used was SUPREP. Scope In: 8:37:16 AM Scope Out: 8:48:22 AM Scope Withdrawal Time: 0 hours 8 minutes 44 seconds  Total Procedure Duration: 0 hours 11 minutes 6 seconds  Findings:                 The perianal exam findings include non-thrombosed                            internal hemorrhoids.                           Multiple diverticula were found in the sigmoid                            colon, hepatic flexure and ascending colon.  Internal hemorrhoids were found during retroflexion                            and during perianal exam. The hemorrhoids were                            small.                           The exam was otherwise without abnormality. Complications:            No immediate complications. Estimated Blood Loss:     Estimated blood loss: none. Impression:               - Mild diverticulosis in the sigmoid colon, at the                            hepatic flexure and in the ascending colon.                           - Internal hemorrhoids.                           - The examination was otherwise normal.                           - No specimens collected. Recommendation:           - Patient has a contact number available for                            emergencies. The signs and symptoms of potential                            delayed  complications were discussed with the                            patient. Return to normal activities tomorrow.                            Written discharge instructions were provided to the                            patient.                           - Resume previous diet.                           - Continue present medications.                           - Repeat colonoscopy in 5 years for screening                            purposes. Jerene Bears, MD 01/05/2016 8:52:22 AM This report has been signed electronically.

## 2016-01-05 NOTE — Progress Notes (Signed)
Report to PACU, RN, vss, BBS= Clear.  

## 2016-01-06 ENCOUNTER — Telehealth: Payer: Self-pay

## 2016-01-06 NOTE — Telephone Encounter (Signed)
Attempted to reach pt. For follow up call.   Left message on ans. Machine.   Will try again to reach pt. Later.

## 2016-01-06 NOTE — Telephone Encounter (Signed)
  Follow up Call-  Call back number 01/05/2016  Post procedure Call Back phone  # [phone]  Permission to leave phone message Yes  Some recent data might be hidden     Patient questions:  Do you have a fever, pain , or abdominal swelling? No. Pain Score  0 *  Have you tolerated food without any problems? Yes.    Have you been able to return to your normal activities? Yes.    Do you have any questions about your discharge instructions: Diet   No. Medications  No. Follow up visit  No.  Do you have questions or concerns about your Care? No.  Actions: * If pain score is 4 or above: No action needed, pain <4.

## 2016-04-08 ENCOUNTER — Ambulatory Visit: Payer: No Typology Code available for payment source | Admitting: Family

## 2016-04-13 ENCOUNTER — Ambulatory Visit (INDEPENDENT_AMBULATORY_CARE_PROVIDER_SITE_OTHER): Payer: No Typology Code available for payment source | Admitting: Family

## 2016-04-13 ENCOUNTER — Other Ambulatory Visit: Payer: Self-pay

## 2016-04-13 ENCOUNTER — Encounter: Payer: Self-pay | Admitting: Family

## 2016-04-13 VITALS — BP 150/90 | HR 68 | Temp 98.1°F | Resp 16 | Ht 62.5 in | Wt 124.8 lb

## 2016-04-13 DIAGNOSIS — R222 Localized swelling, mass and lump, trunk: Secondary | ICD-10-CM | POA: Diagnosis not present

## 2016-04-13 DIAGNOSIS — Z23 Encounter for immunization: Secondary | ICD-10-CM

## 2016-04-13 DIAGNOSIS — J0140 Acute pansinusitis, unspecified: Secondary | ICD-10-CM

## 2016-04-13 DIAGNOSIS — J329 Chronic sinusitis, unspecified: Secondary | ICD-10-CM | POA: Insufficient documentation

## 2016-04-13 MED ORDER — FLUTICASONE PROPIONATE 50 MCG/ACT NA SUSP: 2.0000 | Freq: Every day | NASAL | 1 refills | Status: DC

## 2016-04-13 MED ORDER — BENZONATATE 100 MG PO CAPS: 100.0000 mg | ORAL_CAPSULE | Freq: Two times a day (BID) | ORAL | 0 refills | Status: DC | PRN

## 2016-04-13 MED ORDER — DOXYCYCLINE HYCLATE 100 MG PO TABS: 100.0000 mg | ORAL_TABLET | Freq: Two times a day (BID) | ORAL | 0 refills | Status: DC

## 2016-04-13 NOTE — Progress Notes (Signed)
Subjective:    Patient ID: Kathy Riley, female    DOB: [birth year], 57 y.o.   MRN: [id number]  Chief Complaint  Patient presents with  . Cyst    has a cyst or knot on her back, nasal drainage    HPI:  Kathy Riley is a 56 y.o. female who  has a past medical history of Allergy; Chicken pox; Colon polyps; Diverticulitis; Hypertension; Kidney stones; and UTI (urinary tract infection). and presents today for an office visit.   1.) Knot in back - This is a new problem. Associated symptom of a "knot" located in her back has been going on for about 1 month. Described as nontender/non-painful. No changes in size that she is aware of. No trauma injury. No fevers, chills, or signs of infection. Denies any modifying factors or attempted treatments that make it better or worse.   2.) Nasal congestion - This is a new problem associated symptom of nasal congestion and cough has been going on for about 1 month. Denies other cold/flu-like symptoms. Modifying factors include Tessalon pearls and Airborne. Notes symptoms have gradually improved but have generally remained consistent. Severity is rated mild/moderate. Timing of symptoms is worse at night and in the mornings.   BP Readings from Last 3 Encounters:  04/13/16 (!) 150/90  01/05/16 133/74  07/07/15 (!) 152/98    Allergies  Allergen Reactions  . Penicillins     Fever, happened as a child       Outpatient Medications Prior to Visit  Medication Sig Dispense Refill  . Estradiol (VAGIFEM) 10 MCG TABS vaginal tablet INSERT 1 TABLET VAGINALLY AT BEDTIME FOR 2 WEEKS, THEN 1 TAB 2 TIMES A WEEK    . hydrochlorothiazide (MICROZIDE) 12.5 MG capsule TAKE 1 CAPSULE (12.5 MG TOTAL) BY MOUTH DAILY. 90 capsule 1  . naproxen (NAPROSYN) 500 MG tablet TAKE 1 TABLET BY MOUTH 2 TIMES DAILY AS NEEDED FOR PAIN. TAKE WITH FOOD/MILK TO REDUCE GI UPSET. 30 tablet 0   Facility-Administered Medications Prior to Visit  Medication Dose Route Frequency  Provider Last Rate Last Dose  . 0.9 %  sodium chloride infusion  500 mL Intravenous Continuous Jerene Bears, MD          Past Surgical History:  Procedure Laterality Date  . APPENDECTOMY    . BREAST SURGERY    . COLONOSCOPY W/ POLYPECTOMY    . DILATION AND CURETTAGE OF UTERUS    . TONSILLECTOMY        Past Medical History:  Diagnosis Date  . Allergy   . Chicken pox   . Colon polyps   . Diverticulitis   . Hypertension   . Kidney stones   . UTI (urinary tract infection)       Review of Systems  Constitutional: Negative for chills and fever.  HENT: Positive for congestion and sinus pressure. Negative for ear pain and sore throat.   Respiratory: Positive for cough.   Cardiovascular: Negative for chest pain, palpitations and leg swelling.  Musculoskeletal:       Positive for mass/lesion of back.       Objective:    BP (!) 150/90 (BP Location: Left Arm, Patient Position: Sitting, Cuff Size: Normal)   Pulse 68   Temp 98.1 F (36.7 C) (Oral)   Resp 16   Ht 5' 2.5" (1.588 m)   Wt 124 lb 12.8 oz (56.6 kg)   SpO2 98%   BMI 22.46 kg/m  Nursing note and vital signs reviewed.  Physical Exam  Constitutional: She is oriented to person, place, and time. She appears well-developed and well-nourished. No distress.  HENT:  Right Ear: Hearing, tympanic membrane, external ear and ear canal normal.  Left Ear: Hearing, tympanic membrane, external ear and ear canal normal.  Nose: Nose normal. Right sinus exhibits no maxillary sinus tenderness and no frontal sinus tenderness. Left sinus exhibits no maxillary sinus tenderness and no frontal sinus tenderness.  Mouth/Throat: Uvula is midline, oropharynx is clear and moist and mucous membranes are normal.  Cardiovascular: Normal rate, regular rhythm, normal heart sounds and intact distal pulses.   Pulmonary/Chest: Effort normal and breath sounds normal.  Musculoskeletal:  Lumbar spine - No obvious edema or discoloration with a mildly  firm mobile mass located on the left side of her lower thoracic spine that is non-tender. There are defined borders.   Neurological: She is alert and oriented to person, place, and time.  Skin: Skin is warm and dry.  Psychiatric: She has a normal mood and affect. Her behavior is normal. Judgment and thought content normal.       Assessment & Plan:   Problem List Items Addressed This Visit      Respiratory   Sinusitis    Symptoms and exam consistent with pansinusitis. Start doxycycline. Continue over-the-counter medications as needed for symptom relief and supportive care. Follow-up if symptoms worsen or do not improve.      Relevant Medications   doxycycline (VIBRA-TABS) 100 MG tablet     Other   Palpable mass of lower back - Primary    Mass appears consistent with bengin cyst that is non-tender. Recommend watchful waiting and monitor if cyst changes in size or becomes tender.        Other Visit Diagnoses    Encounter for immunization       Relevant Orders   Flu Vaccine QUAD 36+ mos IM (Completed)       I am having Ms. Dehne start on doxycycline. I am also having her maintain her hydrochlorothiazide, naproxen, and Estradiol. We will continue to administer sodium chloride.   Meds ordered this encounter  Medications  . doxycycline (VIBRA-TABS) 100 MG tablet    Sig: Take 1 tablet (100 mg total) by mouth 2 (two) times daily.    Dispense:  14 tablet    Refill:  0    Order Specific Question:   Supervising Provider    Answer:   Pricilla Holm A [id number]     Follow-up: Return if symptoms worsen or fail to improve.  Mauricio Po, FNP

## 2016-04-13 NOTE — Patient Instructions (Addendum)
Thank you for choosing [facility].  SUMMARY AND INSTRUCTIONS:  Your back appears to be a benign cyst which is a collection of tissue.  Recommend continuing to monitor at this time and follow up for tenderness or changes in size.   Medication:  Your prescription(s) have been submitted to your pharmacy or been printed and provided for you. Please take as directed and contact our office if you believe you are having problem(s) with the medication(s) or have any questions.  Follow up:  If your symptoms worsen or fail to improve, please contact our office for further instruction, or in case of emergency go directly to the emergency room at the closest medical facility.    General Recommendations:    Please drink plenty of fluids.  Get plenty of rest   Sleep in humidified air  Use saline nasal sprays  Netti pot   OTC Medications:  Decongestants - helps relieve congestion   Flonase (generic fluticasone) or Nasacort (generic triamcinolone) - please make sure to use the "cross-over" technique at a 45 degree angle towards the opposite eye as opposed to straight up the nasal passageway.   Sudafed (generic pseudoephedrine - Note this is the one that is available behind the pharmacy counter); Products with phenylephrine (-PE) may also be used but is often not as effective as pseudoephedrine.   If you have HIGH BLOOD PRESSURE - Coricidin HBP; AVOID any product that is -D as this contains pseudoephedrine which may increase your blood pressure.  Afrin (oxymetazoline) every 6-8 hours for up to 3 days.   Allergies - helps relieve runny nose, itchy eyes and sneezing   Claritin (generic loratidine), Allegra (fexofenidine), or Zyrtec (generic cyrterizine) for runny nose. These medications should not cause drowsiness.  Note - Benadryl (generic diphenhydramine) may be used however may cause drowsiness  Cough -   Delsym or Robitussin (generic dextromethorphan)  Expectorants -  helps loosen mucus to ease removal   Mucinex (generic guaifenesin) as directed on the package.  Headaches / General Aches   Tylenol (generic acetaminophen) - DO NOT EXCEED 3 grams (3,000 mg) in a 24 hour time period  Advil/Motrin (generic ibuprofen)   Sore Throat -   Salt water gargle   Chloraseptic (generic benzocaine) spray or lozenges / Sucrets (generic dyclonine)    Sinusitis Sinusitis is redness, soreness, and inflammation of the paranasal sinuses. Paranasal sinuses are air pockets within the bones of your face (beneath the eyes, the middle of the forehead, or above the eyes). In healthy paranasal sinuses, mucus is able to drain out, and air is able to circulate through them by way of your nose. However, when your paranasal sinuses are inflamed, mucus and air can become trapped. This can allow bacteria and other germs to grow and cause infection. Sinusitis can develop quickly and last only a short time (acute) or continue over a long period (chronic). Sinusitis that lasts for more than 12 weeks is considered chronic.  CAUSES  Causes of sinusitis include:  Allergies.  Structural abnormalities, such as displacement of the cartilage that separates your nostrils (deviated septum), which can decrease the air flow through your nose and sinuses and affect sinus drainage.  Functional abnormalities, such as when the small hairs (cilia) that line your sinuses and help remove mucus do not work properly or are not present. SIGNS AND SYMPTOMS  Symptoms of acute and chronic sinusitis are the same. The primary symptoms are pain and pressure around the affected sinuses. Other symptoms include:  Upper  toothache.  Earache.  Headache.  Bad breath.  Decreased sense of smell and taste.  A cough, which worsens when you are lying flat.  Fatigue.  Fever.  Thick drainage from your nose, which often is green and may contain pus (purulent).  Swelling and warmth over the affected  sinuses. DIAGNOSIS  Your health care provider will perform a physical exam. During the exam, your health care provider may:  Look in your nose for signs of abnormal growths in your nostrils (nasal polyps).  Tap over the affected sinus to check for signs of infection.  View the inside of your sinuses (endoscopy) using an imaging device that has a light attached (endoscope). If your health care provider suspects that you have chronic sinusitis, one or more of the following tests may be recommended:  Allergy tests.  Nasal culture. A sample of mucus is taken from your nose, sent to a lab, and screened for bacteria.  Nasal cytology. A sample of mucus is taken from your nose and examined by your health care provider to determine if your sinusitis is related to an allergy. TREATMENT  Most cases of acute sinusitis are related to a viral infection and will resolve on their own within 10 days. Sometimes medicines are prescribed to help relieve symptoms (pain medicine, decongestants, nasal steroid sprays, or saline sprays).  However, for sinusitis related to a bacterial infection, your health care provider will prescribe antibiotic medicines. These are medicines that will help kill the bacteria causing the infection.  Rarely, sinusitis is caused by a fungal infection. In theses cases, your health care provider will prescribe antifungal medicine. For some cases of chronic sinusitis, surgery is needed. Generally, these are cases in which sinusitis recurs more than 3 times per year, despite other treatments. HOME CARE INSTRUCTIONS   Drink plenty of water. Water helps thin the mucus so your sinuses can drain more easily.  Use a humidifier.  Inhale steam 3 to 4 times a day (for example, sit in the bathroom with the shower running).  Apply a warm, moist washcloth to your face 3 to 4 times a day, or as directed by your health care provider.  Use saline nasal sprays to help moisten and clean your  sinuses.  Take medicines only as directed by your health care provider.  If you were prescribed either an antibiotic or antifungal medicine, finish it all even if you start to feel better. SEEK IMMEDIATE MEDICAL CARE IF:  You have increasing pain or severe headaches.  You have nausea, vomiting, or drowsiness.  You have swelling around your face.  You have vision problems.  You have a stiff neck.  You have difficulty breathing. MAKE SURE YOU:   Understand these instructions.  Will watch your condition.  Will get help right away if you are not doing well or get worse. Document Released: 03/08/2005 Document Revised: 07/23/2013 Document Reviewed: 03/23/2011 [facility] Patient Information 2015 [place], [place]. This information is not intended to replace advice given to you by your health care provider. Make sure you discuss any questions you have with your health care provider.

## 2016-04-13 NOTE — Assessment & Plan Note (Signed)
Symptoms and exam consistent with pansinusitis. Start doxycycline. Continue over-the-counter medications as needed for symptom relief and supportive care. Follow-up if symptoms worsen or do not improve.

## 2016-04-13 NOTE — Assessment & Plan Note (Signed)
Mass appears consistent with bengin cyst that is non-tender. Recommend watchful waiting and monitor if cyst changes in size or becomes tender.

## 2016-05-10 ENCOUNTER — Other Ambulatory Visit: Payer: Self-pay | Admitting: Family

## 2016-05-10 NOTE — Telephone Encounter (Signed)
Please have her schedule an appointment will fill for 30 days.

## 2016-05-10 NOTE — Telephone Encounter (Signed)
Routing to greg, last office visit addressing hypertension was July/16---are you ok with refilling, please advise, thanks

## 2016-05-10 NOTE — Telephone Encounter (Signed)
Advised patient to make appt for any further refills, 30 day supply sent today--will need office visit for further refills

## 2016-05-17 ENCOUNTER — Other Ambulatory Visit (INDEPENDENT_AMBULATORY_CARE_PROVIDER_SITE_OTHER): Payer: No Typology Code available for payment source

## 2016-05-17 ENCOUNTER — Encounter: Payer: Self-pay | Admitting: Family

## 2016-05-17 ENCOUNTER — Ambulatory Visit (INDEPENDENT_AMBULATORY_CARE_PROVIDER_SITE_OTHER): Payer: No Typology Code available for payment source | Admitting: Family

## 2016-05-17 VITALS — BP 128/96 | HR 76 | Temp 98.3°F | Resp 16 | Ht 62.25 in | Wt 124.0 lb

## 2016-05-17 DIAGNOSIS — R221 Localized swelling, mass and lump, neck: Secondary | ICD-10-CM | POA: Insufficient documentation

## 2016-05-17 DIAGNOSIS — I10 Essential (primary) hypertension: Secondary | ICD-10-CM

## 2016-05-17 DIAGNOSIS — B029 Zoster without complications: Secondary | ICD-10-CM

## 2016-05-17 HISTORY — DX: Zoster without complications: B02.9

## 2016-05-17 LAB — CBC WITH DIFFERENTIAL/PLATELET
BASOS ABS: 0.1 10*3/uL (ref 0.0–0.1)
BASOS PCT: 0.9 % (ref 0.0–3.0)
EOS ABS: 0.2 10*3/uL (ref 0.0–0.7)
Eosinophils Relative: 2.5 % (ref 0.0–5.0)
HCT: 43 % (ref 36.0–46.0)
Hemoglobin: 14.7 g/dL (ref 12.0–15.0)
LYMPHS ABS: 1.6 10*3/uL (ref 0.7–4.0)
Lymphocytes Relative: 22.4 % (ref 12.0–46.0)
MCHC: 34.1 g/dL (ref 30.0–36.0)
MCV: 91.1 fl (ref 78.0–100.0)
MONOS PCT: 10 % (ref 3.0–12.0)
Monocytes Absolute: 0.7 10*3/uL (ref 0.1–1.0)
NEUTROS ABS: 4.5 10*3/uL (ref 1.4–7.7)
Neutrophils Relative %: 64.2 % (ref 43.0–77.0)
PLATELETS: 327 10*3/uL (ref 150.0–400.0)
RBC: 4.72 Mil/uL (ref 3.87–5.11)
RDW: 12.9 % (ref 11.5–15.5)
WBC: 7 10*3/uL (ref 4.0–10.5)

## 2016-05-17 LAB — HM PAP SMEAR: HM PAP: NEGATIVE

## 2016-05-17 LAB — TSH: TSH: 3.12 u[IU]/mL (ref 0.35–4.50)

## 2016-05-17 MED ORDER — OMEPRAZOLE 20 MG PO CPDR
20.0000 mg | DELAYED_RELEASE_CAPSULE | Freq: Every day | ORAL | 3 refills | Status: DC
Start: 2016-05-17 — End: 2017-03-23

## 2016-05-17 MED ORDER — HYDROCHLOROTHIAZIDE 12.5 MG PO CAPS: ORAL_CAPSULE | ORAL | 1 refills | Status: DC

## 2016-05-17 MED ORDER — VALACYCLOVIR HCL 1 G PO TABS: 1000.0000 mg | ORAL_TABLET | Freq: Three times a day (TID) | ORAL | 0 refills | Status: DC

## 2016-05-17 NOTE — Assessment & Plan Note (Signed)
New onset concern for neck swelling with mild discomfort noted in the anterior neck. Obtain CBC w/ diff, TSH and ultrasound of the neck and soft tissues. Continue to monitor pending imaging and lab work. Question GERD. Trial omeprazole. Advised to seek further care if symptoms of shortness of breath worsen.

## 2016-05-17 NOTE — Assessment & Plan Note (Signed)
Hypertension remains labile with current medication regimen and above goal 140/90. Continue current dosage of hydrochlorothiazide. Encouraged monitor blood pressure at home. Follow low-sodium diet. Denies worst headache of life with no new symptoms of end organ damage noted on physical exam. Continue to monitor.

## 2016-05-17 NOTE — Progress Notes (Signed)
Subjective:    Patient ID: Kathy Riley, female    DOB: 04/10/1960, 56 y.o.   MRN: [id number]  Chief Complaint  Patient presents with  . Medication Refill    Requesting refill of HCTZ, a TSH panel done, and rash on chest x2 and half weeks     HPI:  Kathy Riley is a 56 y.o. female who  has a past medical history of Allergy; Chicken pox; Colon polyps; Diverticulitis; Hypertension; Kidney stones; and UTI (urinary tract infection). and presents today for an office follow up.  1.) Hypertension - Currently maintianed on hydrochlorothiazide. Reports taking the medications as prescribed and denies worst headaceh of life or symptoms of end organ damage. Not currently check blood pressure at home. Following a low sodium diet and physical activity regularly.  BP Readings from Last 3 Encounters:  05/17/16 (!) 128/96  04/13/16 (!) 150/90  01/05/16 133/74    2.) Neck swelling - Associated symptom of pressure located in her bilateral neck that has been going on for several months and describes that she feels like she difficulty with breathing on occasion. Has noted a decreased exercise tolerance as well. Feels a tightening up of various muscles from jaw, arm and face. Described as not being able to fill her lungs up in a proper way. No dysphasia, fevers, or chills. Able to eat without complication.   3.) Rash - Associated symptom of a rash located on the center of her chest that has been going on for about 2 weeks. Describes the rash as starting as a tiny bump and described as burning and itching. Modifying factors include triamcinolone which caused it to burn more. Has also tried neosporin which may be helping a little.    Allergies  Allergen Reactions  . Penicillins     Fever, happened as a child       Outpatient Medications Prior to Visit  Medication Sig Dispense Refill  . Estradiol (VAGIFEM) 10 MCG TABS vaginal tablet INSERT 1 TABLET VAGINALLY AT BEDTIME FOR 2 WEEKS, THEN 1  TAB 2 TIMES A WEEK    . fluticasone (FLONASE) 50 MCG/ACT nasal spray Place 2 sprays into both nostrils daily. 16 g 1  . naproxen (NAPROSYN) 500 MG tablet TAKE 1 TABLET BY MOUTH 2 TIMES DAILY AS NEEDED FOR PAIN. TAKE WITH FOOD/MILK TO REDUCE GI UPSET. 30 tablet 0  . hydrochlorothiazide (MICROZIDE) 12.5 MG capsule TAKE 1 CAPSULE (12.5 MG TOTAL) BY MOUTH DAILY. 30 capsule 0  . benzonatate (TESSALON) 100 MG capsule Take 1 capsule (100 mg total) by mouth 2 (two) times daily as needed for cough. 30 capsule 0  . doxycycline (VIBRA-TABS) 100 MG tablet Take 1 tablet (100 mg total) by mouth 2 (two) times daily. 14 tablet 0   Facility-Administered Medications Prior to Visit  Medication Dose Route Frequency Provider Last Rate Last Dose  . 0.9 %  sodium chloride infusion  500 mL Intravenous Continuous Jerene Bears, MD          Past Surgical History:  Procedure Laterality Date  . APPENDECTOMY    . BREAST SURGERY    . COLONOSCOPY W/ POLYPECTOMY    . DILATION AND CURETTAGE OF UTERUS    . TONSILLECTOMY        Past Medical History:  Diagnosis Date  . Allergy   . Chicken pox   . Colon polyps   . Diverticulitis   . Hypertension   . Kidney stones   . UTI (urinary tract infection)  Review of Systems  Constitutional: Negative for chills, diaphoresis, fatigue and fever.  HENT: Negative for drooling, trouble swallowing and voice change.   Eyes:       Negative for changes in vision  Respiratory: Negative for cough, chest tightness and wheezing.   Cardiovascular: Negative for chest pain, palpitations and leg swelling.  Endocrine: Negative for cold intolerance, heat intolerance, polydipsia, polyphagia and polyuria.  Skin: Positive for rash.  Neurological: Negative for dizziness, weakness and light-headedness.      Objective:    BP (!) 128/96 (BP Location: Left Arm, Patient Position: Sitting, Cuff Size: Normal)   Pulse 76   Temp 98.3 F (36.8 C) (Oral)   Resp 16   Ht 5' 2.25" (1.581  m)   Wt 124 lb (56.2 kg)   SpO2 97%   BMI 22.50 kg/m  Nursing note and vital signs reviewed.  Physical Exam  Constitutional: She is oriented to person, place, and time. She appears well-developed and well-nourished. No distress.  Cardiovascular: Normal rate, regular rhythm, normal heart sounds and intact distal pulses.   Pulmonary/Chest: Effort normal and breath sounds normal. No respiratory distress. She has no wheezes. She has no rales. She exhibits no tenderness.  Neurological: She is alert and oriented to person, place, and time.  Skin: Skin is warm and dry.  Positive for a vesicular rash in a dermatomal pattern located across the top of her left breast within the potential T4 dermatome.  Psychiatric: She has a normal mood and affect. Her behavior is normal. Judgment and thought content normal.       Assessment & Plan:   Problem List Items Addressed This Visit      Cardiovascular and Mediastinum   Essential hypertension    Hypertension remains labile with current medication regimen and above goal 140/90. Continue current dosage of hydrochlorothiazide. Encouraged monitor blood pressure at home. Follow low-sodium diet. Denies worst headache of life with no new symptoms of end organ damage noted on physical exam. Continue to monitor.      Relevant Medications   hydrochlorothiazide (MICROZIDE) 12.5 MG capsule     Other   Neck swelling    New onset concern for neck swelling with mild discomfort noted in the anterior neck. Obtain CBC w/ diff, TSH and ultrasound of the neck and soft tissues. Continue to monitor pending imaging and lab work. Question GERD. Trial omeprazole. Advised to seek further care if symptoms of shortness of breath worsen.       Relevant Orders   TSH (Completed)   CBC w/Diff (Completed)   US Soft Tissue Head/Neck   Herpes zoster without complication - Primary    Vesicular rash in a dermatomal pattern consistent with herpes zoster. Start Valtrex. Recommend  over-the-counter medications and nonpharmacological treatments as needed for discomfort. Declines gabapentin. Follow-up if symptoms worsen or do not improve.      Relevant Medications   valACYclovir (VALTREX) 1000 MG tablet       I have discontinued Ms. Garrels's doxycycline and benzonatate. I am also having her start on valACYclovir and omeprazole. Additionally, I am having her maintain her naproxen, Estradiol, fluticasone, and hydrochlorothiazide. We will continue to administer sodium chloride.   Meds ordered this encounter  Medications  . valACYclovir (VALTREX) 1000 MG tablet    Sig: Take 1 tablet (1,000 mg total) by mouth 3 (three) times daily.    Dispense:  21 tablet    Refill:  0    Order Specific Question:   Supervising Provider  Answer:   Pricilla Holm A [id number]  . hydrochlorothiazide (MICROZIDE) 12.5 MG capsule    Sig: TAKE 1 CAPSULE (12.5 MG TOTAL) BY MOUTH DAILY.    Dispense:  90 capsule    Refill:  1    Order Specific Question:   Supervising Provider    Answer:   Pricilla Holm A [id number]  . omeprazole (PRILOSEC) 20 MG capsule    Sig: Take 1 capsule (20 mg total) by mouth daily.    Dispense:  30 capsule    Refill:  3    Order Specific Question:   Supervising Provider    Answer:   Pricilla Holm A [id number]     Follow-up: Return in about 1 month (around 06/14/2016), or if symptoms worsen or fail to improve.  Mauricio Po, FNP

## 2016-05-17 NOTE — Assessment & Plan Note (Signed)
Vesicular rash in a dermatomal pattern consistent with herpes zoster. Start Valtrex. Recommend over-the-counter medications and nonpharmacological treatments as needed for discomfort. Declines gabapentin. Follow-up if symptoms worsen or do not improve.

## 2016-05-17 NOTE — Patient Instructions (Addendum)
Thank you for choosing [facility].  SUMMARY AND INSTRUCTIONS:  They will call to schedule your appointment for the ultrasound.  Medication:  Start the Valtrex.  Continue to take the hydrochlorothiazide.  Start taking omeprazole.  Your prescription(s) have been submitted to your pharmacy or been printed and provided for you. Please take as directed and contact our office if you believe you are having problem(s) with the medication(s) or have any questions.  Labs:  Please stop by the lab on the lower level of the building for your blood work. Your results will be released to [facility] (or called to you) after review, usually within 72 hours after test completion. If any changes need to be made, you will be notified at that same time.  1.) The lab is open from 7:30am to 5:30 pm Monday-Friday 2.) No appointment is necessary 3.) Fasting (if needed) is 6-8 hours after food and drink; black coffee and water are okay   Referrals:  Referrals have been made during this visit. You should expect to hear back from our schedulers in about 7-10 days in regards to establishing an appointment with the specialists we discussed.   Follow up:  If your symptoms worsen or fail to improve, please contact our office for further instruction, or in case of emergency go directly to the emergency room at the closest medical facility.

## 2016-06-07 ENCOUNTER — Ambulatory Visit
Admission: RE | Admit: 2016-06-07 | Discharge: 2016-06-07 | Disposition: A | Discharge status: Home / Self Care | Payer: No Typology Code available for payment source | Source: Ambulatory Visit | Attending: Family | Admitting: Family

## 2016-06-07 DIAGNOSIS — R221 Localized swelling, mass and lump, neck: Secondary | ICD-10-CM

## 2016-09-30 ENCOUNTER — Ambulatory Visit (INDEPENDENT_AMBULATORY_CARE_PROVIDER_SITE_OTHER): Payer: No Typology Code available for payment source | Admitting: Family

## 2016-09-30 ENCOUNTER — Encounter: Payer: Self-pay | Admitting: Family

## 2016-09-30 VITALS — BP 142/92 | HR 84 | Temp 98.1°F | Resp 16 | Ht 62.25 in | Wt 123.8 lb

## 2016-09-30 DIAGNOSIS — H6981 Other specified disorders of Eustachian tube, right ear: Secondary | ICD-10-CM | POA: Diagnosis not present

## 2016-09-30 DIAGNOSIS — R1032 Left lower quadrant pain: Secondary | ICD-10-CM | POA: Diagnosis not present

## 2016-09-30 DIAGNOSIS — H6991 Unspecified Eustachian tube disorder, right ear: Secondary | ICD-10-CM

## 2016-09-30 NOTE — Progress Notes (Signed)
Subjective:    Patient ID: Kathy Riley, female    DOB: 06/18/1960, 56 y.o.   MRN: [id number]  Chief Complaint  Patient presents with  . Dizziness    having issues with dizziness, has lower abdominal pain since last night that she describes as stabbing    HPI:  [name] is a 56 y.o. female who  has a past medical history of Allergy; Chicken pox; Colon polyps; Diverticulitis; Hypertension; Kidney stones; and UTI (urinary tract infection). and presents today for an office visit.   1.) Dizziness - This is a new problem. Associated symptom of right ear fullness has been going on for about 4 months with new onset dizziness within the past couple of weeks. She has restarted taking Vagifem which she discontinued secondary to side effects. Described as fullness and the dizziness feeling off balance. No pain or discharge of the right ear.   2.) Abdominal pain - This is a new problem. Associated symptom of pain located in her left lower abdomen has been going on for about 1 day and described as like something is kicking or punching. Severity of the pain is 6/10 that generally waxes and wanes. No fevers. Endorses some diarrhea. No constipation, nausea, vomiting or blood in her stool. Most recent colonoscopy with diverticula found in the sigmoid colon.   Allergies  Allergen Reactions  . Penicillins     Fever, happened as a child       Outpatient Medications Prior to Visit  Medication Sig Dispense Refill  . Estradiol (VAGIFEM) 10 MCG TABS vaginal tablet INSERT 1 TABLET VAGINALLY AT BEDTIME FOR 2 WEEKS, THEN 1 TAB 2 TIMES A WEEK    . fluticasone (FLONASE) 50 MCG/ACT nasal spray Place 2 sprays into both nostrils daily. 16 g 1  . hydrochlorothiazide (MICROZIDE) 12.5 MG capsule TAKE 1 CAPSULE (12.5 MG TOTAL) BY MOUTH DAILY. 90 capsule 1  . naproxen (NAPROSYN) 500 MG tablet TAKE 1 TABLET BY MOUTH 2 TIMES DAILY AS NEEDED FOR PAIN. TAKE WITH FOOD/MILK TO REDUCE GI UPSET. 30 tablet 0  .  omeprazole (PRILOSEC) 20 MG capsule Take 1 capsule (20 mg total) by mouth daily. 30 capsule 3  . valACYclovir (VALTREX) 1000 MG tablet Take 1 tablet (1,000 mg total) by mouth 3 (three) times daily. 21 tablet 0   Facility-Administered Medications Prior to Visit  Medication Dose Route Frequency Provider Last Rate Last Dose  . 0.9 %  sodium chloride infusion  500 mL Intravenous Continuous Pyrtle, Lajuan Lines, MD          Past Surgical History:  Procedure Laterality Date  . APPENDECTOMY    . BREAST SURGERY    . COLONOSCOPY W/ POLYPECTOMY    . DILATION AND CURETTAGE OF UTERUS    . TONSILLECTOMY        Past Medical History:  Diagnosis Date  . Allergy   . Chicken pox   . Colon polyps   . Diverticulitis   . Hypertension   . Kidney stones   . UTI (urinary tract infection)       Review of Systems  Constitutional: Negative for chills and fever.  HENT: Positive for ear pain. Negative for sinus pain and sinus pressure.   Cardiovascular: Negative for chest pain, palpitations and leg swelling.  Gastrointestinal: Positive for abdominal pain. Negative for blood in stool, constipation, diarrhea, nausea and vomiting.  Neurological: Positive for dizziness. Negative for tremors, syncope, weakness and light-headedness.      Objective:    BP Marland Kitchen)  142/92 (BP Location: Left Arm, Patient Position: Sitting, Cuff Size: Normal)   Pulse 84   Temp 98.1 F (36.7 C) (Oral)   Resp 16   Ht 5' 2.25" (1.581 m)   Wt 123 lb 12.8 oz (56.2 kg)   SpO2 95%   BMI 22.46 kg/m  Nursing note and vital signs reviewed.  Physical Exam  Constitutional: She is oriented to person, place, and time. She appears well-developed and well-nourished. No distress.  HENT:  Right Ear: Hearing, tympanic membrane, external ear and ear canal normal.  Left Ear: Hearing, tympanic membrane, external ear and ear canal normal.  Nose: Nose normal.  Mouth/Throat: Uvula is midline, oropharynx is clear and moist and mucous membranes are  normal.  Cardiovascular: Normal rate, regular rhythm, normal heart sounds and intact distal pulses.   Pulmonary/Chest: Effort normal and breath sounds normal.  Abdominal: Normal appearance and bowel sounds are normal. She exhibits no mass. There is tenderness in the left lower quadrant. There is no rigidity, no guarding, no CVA tenderness, no tenderness at McBurney's point and negative Murphy's sign.  Neurological: She is alert and oriented to person, place, and time.  Skin: Skin is warm and dry.  Psychiatric: She has a normal mood and affect. Her behavior is normal. Judgment and thought content normal.       Assessment & Plan:   Problem List Items Addressed This Visit      Nervous and Auditory   Dysfunction of right eustachian tube    Symptoms and exam consistent with dysfunction of the right eustachian tube most likely related to congestion resulting in dizziness. Encouraged to drink plenty of fluids. Recommend Zyrtec-D or Aleve due to help alleviate symptoms in addition to second-generation nasal corticosteroid. If symptoms worsen or do not improve consider referral to ENT for further assessment.        Other   Left lower quadrant pain - Primary    Left lower quadrant pain with concern for diverticulitis or left ovarian cyst. Recommend follow-up with gynecology for pelvic ultrasound. If no significant findings consider CT scan given previous colonoscopy with small diverticula noted especially in the sigmoid colon. Continue to monitor abdominal symptoms and follow-up if symptoms worsen or do not improve.          I am having [name] maintain her naproxen, Estradiol, fluticasone, valACYclovir, hydrochlorothiazide, and omeprazole. We will continue to administer sodium chloride.   Follow-up: Return in about 1 month (around 10/31/2016), or if symptoms worsen or fail to improve.  Mauricio Po, FNP

## 2016-09-30 NOTE — Assessment & Plan Note (Signed)
Symptoms and exam consistent with dysfunction of the right eustachian tube most likely related to congestion resulting in dizziness. Encouraged to drink plenty of fluids. Recommend Zyrtec-D or Aleve due to help alleviate symptoms in addition to second-generation nasal corticosteroid. If symptoms worsen or do not improve consider referral to ENT for further assessment.

## 2016-09-30 NOTE — Assessment & Plan Note (Signed)
Left lower quadrant pain with concern for diverticulitis or left ovarian cyst. Recommend follow-up with gynecology for pelvic ultrasound. If no significant findings consider CT scan given previous colonoscopy with small diverticula noted especially in the sigmoid colon. Continue to monitor abdominal symptoms and follow-up if symptoms worsen or do not improve.

## 2016-09-30 NOTE — Patient Instructions (Signed)
Thank you for choosing [facility].  SUMMARY AND INSTRUCTIONS:  For your ears:  Recommend start Zyrtec-D or Aleve-D and a nasal spray such as Flonase, Nasacort, or Rhinocort.  Drink plenty of fluids and this should resolve.   If you develop changes in hearing please let us know.  For your abdomen:  Recommend decreasing fiber in your diet.  Continue to monitor symptoms.  Follow up with gynecology for an ultrasound.   If you symptoms do not improve we will consider a CT scan.   Follow up:  If your symptoms worsen or fail to improve, please contact our office for further instruction, or in case of emergency go directly to the emergency room at the closest medical facility.     Diverticulitis Diverticulitis is inflammation or infection of small pouches in your colon that form when you have a condition called diverticulosis. The pouches in your colon are called diverticula. Your colon, or large intestine, is where water is absorbed and stool is formed. Complications of diverticulitis can include:  Bleeding.  Severe infection.  Severe pain.  Perforation of your colon.  Obstruction of your colon.  What are the causes? Diverticulitis is caused by bacteria. Diverticulitis happens when stool becomes trapped in diverticula. This allows bacteria to grow in the diverticula, which can lead to inflammation and infection. What increases the risk? People with diverticulosis are at risk for diverticulitis. Eating a diet that does not include enough fiber from fruits and vegetables may make diverticulitis more likely to develop. What are the signs or symptoms? Symptoms of diverticulitis may include:  Abdominal pain and tenderness. The pain is normally located on the left side of the abdomen, but may occur in other areas.  Fever and chills.  Bloating.  Cramping.  Nausea.  Vomiting.  Constipation.  Diarrhea.  Blood in your stool.  How is this diagnosed? Your  health care provider will ask you about your medical history and do a physical exam. You may need to have tests done because many medical conditions can cause the same symptoms as diverticulitis. Tests may include:  Blood tests.  Urine tests.  Imaging tests of the abdomen, including X-rays and CT scans.  When your condition is under control, your health care provider may recommend that you have a colonoscopy. A colonoscopy can show how severe your diverticula are and whether something else is causing your symptoms. How is this treated? Most cases of diverticulitis are mild and can be treated at home. Treatment may include:  Taking over-the-counter pain medicines.  Following a clear liquid diet.  Taking antibiotic medicines by mouth for 7-10 days.  More severe cases may be treated at a hospital. Treatment may include:  Not eating or drinking.  Taking prescription pain medicine.  Receiving antibiotic medicines through an IV tube.  Receiving fluids and nutrition through an IV tube.  Surgery.  Follow these instructions at home:  Follow your health care provider's instructions carefully.  Follow a full liquid diet or other diet as directed by your health care provider. After your symptoms improve, your health care provider may tell you to change your diet. He or she may recommend you eat a high-fiber diet. Fruits and vegetables are good sources of fiber. Fiber makes it easier to pass stool.  Take fiber supplements or probiotics as directed by your health care provider.  Only take medicines as directed by your health care provider.  Keep all your follow-up appointments. Contact a health care provider if:  Your pain  does not improve.  You have a hard time eating food.  Your bowel movements do not return to normal. Get help right away if:  Your pain becomes worse.  Your symptoms do not get better.  Your symptoms suddenly get worse.  You have a fever.  You have  repeated vomiting.  You have bloody or black, tarry stools. This information is not intended to replace advice given to you by your health care provider. Make sure you discuss any questions you have with your health care provider. Document Released: 12/16/2004 Document Revised: 08/14/2015 Document Reviewed: 01/31/2013 Elsevier Interactive Patient Education  2017 [place] [employer].   Ovarian Cyst An ovarian cyst is a fluid-filled sac on an ovary. The ovaries are organs that make eggs in women. Most ovarian cysts go away on their own and are not cancerous (are benign). Some cysts need treatment. Follow these instructions at home:  Take over-the-counter and prescription medicines only as told by your doctor.  Do not drive or use heavy machinery while taking prescription pain medicine.  Get pelvic exams and Pap tests as often as told by your doctor.  Return to your normal activities as told by your doctor. Ask your doctor what activities are safe for you.  Do not use any products that contain nicotine or tobacco, such as cigarettes and e-cigarettes. If you need help quitting, ask your doctor.  Keep all follow-up visits as told by your doctor. This is important. Contact a doctor if:  Your periods are: ? Late. ? Irregular. ? Painful.  Your periods stop.  You have pelvic pain that does not go away.  You have pressure on your bladder.  You have trouble making your bladder empty when you pee (urinate).  You have pain during sex.  You have any of the following in your belly (abdomen): ? A feeling of fullness. ? Pressure. ? Discomfort. ? Pain that does not go away. ? Swelling.  You feel sick most of the time.  You have trouble pooping (have constipation).  You are not as hungry as usual (you lose your appetite).  You get very bad acne.  You start to have more hair on your body and face.  You are gaining weight or losing weight without changing your exercise and eating  habits.  You think you may be pregnant. Get help right away if:  You have belly pain that is very bad or gets worse.  You cannot eat or drink without throwing up (vomiting).  You suddenly get a fever.  Your period is a lot heavier than usual. This information is not intended to replace advice given to you by your health care provider. Make sure you discuss any questions you have with your health care provider. Document Released: 08/25/2007 Document Revised: 09/26/2015 Document Reviewed: 08/10/2015 Elsevier Interactive Patient Education  2017 [employer].

## 2016-10-13 ENCOUNTER — Other Ambulatory Visit: Payer: Self-pay | Admitting: Family

## 2016-10-15 MED ORDER — NAPROXEN 500 MG PO TABS: 500.0000 mg | ORAL_TABLET | Freq: Two times a day (BID) | ORAL | 0 refills | Status: DC

## 2016-10-15 NOTE — Addendum Note (Signed)
Addended by: Aviva Signs M on: 10/15/2016 04:12 PM   Modules accepted: Orders

## 2016-10-15 NOTE — Telephone Encounter (Signed)
rx re-sent per pharmacy request.

## 2016-11-05 ENCOUNTER — Other Ambulatory Visit: Payer: Self-pay | Admitting: Family

## 2017-01-08 ENCOUNTER — Other Ambulatory Visit: Payer: Self-pay | Admitting: Family

## 2017-01-24 ENCOUNTER — Telehealth: Payer: Self-pay | Admitting: Family

## 2017-01-24 MED ORDER — HYDROCHLOROTHIAZIDE 12.5 MG PO CAPS: ORAL_CAPSULE | ORAL | 1 refills | Status: DC

## 2017-01-24 NOTE — Telephone Encounter (Signed)
Per office policy sent enough med until appt...Kathy Riley

## 2017-01-24 NOTE — Telephone Encounter (Signed)
Pt called for a refill of her hydrochlorothiazide (MICROZIDE) 12.5 MG capsule  Transfer appt set up with [facility] 1/4 Please advise  Send to [facility] in [place] on file

## 2017-02-22 ENCOUNTER — Telehealth: Payer: Self-pay | Admitting: Family

## 2017-02-22 NOTE — Telephone Encounter (Signed)
Copied from [place]. Topic: General - Other >> Feb 21, 2017  2:59 PM Pricilla Handler wrote: Reason for CRM: Patient called very upset wanting to speak with the practice administrator. The provider she had been seeing at [facility] left, and Patient was schedule with one of our NPs for a Transfer of Care New Pat Appt. Patient stated that she has been with [facility] for many years, and cannot understand why she could not be seen by an MD instead of a NP. Patient stated that she just turned 50 and she has some medical concerns that she wants addressed by a doctor. Patient stated that she feels that we do not value her as a patient. Patient also stated if she cannot see a doctor with her primary care, she might as well leave [facility] and find a doctor elsewhere. I advised the patient stating that all of our doctors at [facility] are not accepting New Patient at this time. I did notify the patient that I could schedule her at another [place] office with a doctor seeing New Patients. Patient stated that she is not a new patient. I explained that although she is established with [facility], she would be new to the doctor she will be seeing. Scheduled patient at [facility], as patient stated that this location is the next closest to where she lives. Patient still wants to speak with the [facility] Administrator. Please call this patient as soon as you can.      Thank You!!!

## 2017-02-22 NOTE — Telephone Encounter (Signed)
Followed up with patient.  Offered to schedule patient with Dr. Jenny Reichmann.  Patient states she has an appointment set up at [facility] and is ok with that.

## 2017-03-21 ENCOUNTER — Ambulatory Visit: Payer: No Typology Code available for payment source | Admitting: Internal Medicine

## 2017-03-23 ENCOUNTER — Ambulatory Visit (INDEPENDENT_AMBULATORY_CARE_PROVIDER_SITE_OTHER): Payer: No Typology Code available for payment source

## 2017-03-23 ENCOUNTER — Ambulatory Visit: Payer: No Typology Code available for payment source | Admitting: Internal Medicine

## 2017-03-23 ENCOUNTER — Encounter: Payer: Self-pay | Admitting: Internal Medicine

## 2017-03-23 VITALS — BP 130/100 | HR 66 | Temp 97.7°F | Ht 63.0 in | Wt 132.2 lb

## 2017-03-23 DIAGNOSIS — R109 Unspecified abdominal pain: Secondary | ICD-10-CM

## 2017-03-23 DIAGNOSIS — Z1159 Encounter for screening for other viral diseases: Secondary | ICD-10-CM

## 2017-03-23 DIAGNOSIS — R208 Other disturbances of skin sensation: Secondary | ICD-10-CM

## 2017-03-23 DIAGNOSIS — R5383 Other fatigue: Secondary | ICD-10-CM | POA: Insufficient documentation

## 2017-03-23 DIAGNOSIS — I10 Essential (primary) hypertension: Secondary | ICD-10-CM

## 2017-03-23 DIAGNOSIS — R1011 Right upper quadrant pain: Secondary | ICD-10-CM | POA: Insufficient documentation

## 2017-03-23 DIAGNOSIS — M549 Dorsalgia, unspecified: Secondary | ICD-10-CM

## 2017-03-23 DIAGNOSIS — E559 Vitamin D deficiency, unspecified: Secondary | ICD-10-CM | POA: Diagnosis not present

## 2017-03-23 DIAGNOSIS — M542 Cervicalgia: Secondary | ICD-10-CM

## 2017-03-23 DIAGNOSIS — G4700 Insomnia, unspecified: Secondary | ICD-10-CM | POA: Insufficient documentation

## 2017-03-23 DIAGNOSIS — M791 Myalgia, unspecified site: Secondary | ICD-10-CM | POA: Diagnosis not present

## 2017-03-23 DIAGNOSIS — M2550 Pain in unspecified joint: Secondary | ICD-10-CM

## 2017-03-23 DIAGNOSIS — E785 Hyperlipidemia, unspecified: Secondary | ICD-10-CM

## 2017-03-23 DIAGNOSIS — R200 Anesthesia of skin: Secondary | ICD-10-CM

## 2017-03-23 DIAGNOSIS — R222 Localized swelling, mass and lump, trunk: Secondary | ICD-10-CM

## 2017-03-23 DIAGNOSIS — F5101 Primary insomnia: Secondary | ICD-10-CM | POA: Insufficient documentation

## 2017-03-23 LAB — URINALYSIS, ROUTINE W REFLEX MICROSCOPIC
Bilirubin Urine: NEGATIVE
HGB URINE DIPSTICK: NEGATIVE
Ketones, ur: NEGATIVE
Leukocytes, UA: NEGATIVE
NITRITE: NEGATIVE
RBC / HPF: NONE SEEN (ref 0–?)
Specific Gravity, Urine: 1.02 (ref 1.000–1.030)
TOTAL PROTEIN, URINE-UPE24: NEGATIVE
URINE GLUCOSE: NEGATIVE
UROBILINOGEN UA: 0.2 (ref 0.0–1.0)
pH: 6 (ref 5.0–8.0)

## 2017-03-23 LAB — SEDIMENTATION RATE: Sed Rate: 2 mm/hr (ref 0–30)

## 2017-03-23 LAB — CK: CK TOTAL: 61 U/L (ref 7–177)

## 2017-03-23 LAB — T4, FREE: FREE T4: 0.68 ng/dL (ref 0.60–1.60)

## 2017-03-23 LAB — C-REACTIVE PROTEIN: CRP: <0.1 mg/dL — ABNORMAL LOW (ref 0.5–20.0)

## 2017-03-23 NOTE — Patient Instructions (Signed)
We need to get further labs, mammogram from OB/GYN  Please f/u in 2-3 weeks  Xray neck and mid back today and ultrasound of abdomen today  We will see what radiology says about working up the spot on your low back either MRI/CT  Try vitamin D3 5000 iu daily  Try melatonin 3-6 mg 1 hour before bed nightly for sleep  Take care   Joint Pain Joint pain, which is also called arthralgia, can be caused by many things. Joint pain often goes away when you follow your health care provider's instructions for relieving pain at home. However, joint pain can also be caused by conditions that require further treatment. Common causes of joint pain include:  Bruising in the area of the joint.  Overuse of the joint.  Wear and tear on the joints that occur with aging (osteoarthritis).  Various other forms of arthritis.  A buildup of a crystal form of uric acid in the joint (gout).  Infections of the joint (septic arthritis) or of the bone (osteomyelitis).  Your health care provider may recommend medicine to help with the pain. If your joint pain continues, additional tests may be needed to diagnose your condition. Follow these instructions at home: Watch your condition for any changes. Follow these instructions as directed to lessen the pain that you are feeling.  Take medicines only as directed by your health care provider.  Rest the affected area for as long as your health care provider says that you should. If directed to do so, raise the painful joint above the level of your heart while you are sitting or lying down.  Do not do things that cause or worsen pain.  If directed, apply ice to the painful area: ? Put ice in a plastic bag. ? Place a towel between your skin and the bag. ? Leave the ice on for 20 minutes, 2-3 times per day.  Wear an elastic bandage, splint, or sling as directed by your health care provider. Loosen the elastic bandage or splint if your fingers or toes become numb and  tingle, or if they turn cold and blue.  Begin exercising or stretching the affected area as directed by your health care provider. Ask your health care provider what types of exercise are safe for you.  Keep all follow-up visits as directed by your health care provider. This is important.  Contact a health care provider if:  Your pain increases, and medicine does not help.  Your joint pain does not improve within 3 days.  You have increased bruising or swelling.  You have a fever.  You lose 10 lb (4.5 kg) or more without trying. Get help right away if:  You are not able to move the joint.  Your fingers or toes become numb or they turn cold and blue. This information is not intended to replace advice given to you by your health care provider. Make sure you discuss any questions you have with your health care provider. Document Released: 03/08/2005 Document Revised: 08/08/2015 Document Reviewed: 12/18/2013 Elsevier Interactive Patient Education  2018 [employer].  Insomnia Insomnia is a sleep disorder that makes it difficult to fall asleep or to stay asleep. Insomnia can cause tiredness (fatigue), low energy, difficulty concentrating, mood swings, and poor performance at work or school. There are three different ways to classify insomnia:  Difficulty falling asleep.  Difficulty staying asleep.  Waking up too early in the morning.  Any type of insomnia can be long-term (chronic) or  short-term (acute). Both are common. Short-term insomnia usually lasts for three months or less. Chronic insomnia occurs at least three times a week for longer than three months. What are the causes? Insomnia may be caused by another condition, situation, or substance, such as:  Anxiety.  Certain medicines.  Gastroesophageal reflux disease (GERD) or other gastrointestinal conditions.  Asthma or other breathing conditions.  Restless legs syndrome, sleep apnea, or other sleep  disorders.  Chronic pain.  Menopause. This may include hot flashes.  Stroke.  Abuse of alcohol, tobacco, or illegal drugs.  Depression.  Caffeine.  Neurological disorders, such as Alzheimer disease.  An overactive thyroid (hyperthyroidism).  The cause of insomnia may not be known. What increases the risk? Risk factors for insomnia include:  Gender. Women are more commonly affected than men.  Age. Insomnia is more common as you get older.  Stress. This may involve your professional or personal life.  Income. Insomnia is more common in people with lower income.  Lack of exercise.  Irregular work schedule or night shifts.  Traveling between different time zones.  What are the signs or symptoms? If you have insomnia, trouble falling asleep or trouble staying asleep is the main symptom. This may lead to other symptoms, such as:  Feeling fatigued.  Feeling nervous about going to sleep.  Not feeling rested in the morning.  Having trouble concentrating.  Feeling irritable, anxious, or depressed.  How is this treated? Treatment for insomnia depends on the cause. If your insomnia is caused by an underlying condition, treatment will focus on addressing the condition. Treatment may also include:  Medicines to help you sleep.  Counseling or therapy.  Lifestyle adjustments.  Follow these instructions at home:  Take medicines only as directed by your health care provider.  Keep regular sleeping and waking hours. Avoid naps.  Keep a sleep diary to help you and your health care provider figure out what could be causing your insomnia. Include: ? When you sleep. ? When you wake up during the night. ? How well you sleep. ? How rested you feel the next day. ? Any side effects of medicines you are taking. ? What you eat and drink.  Make your bedroom a comfortable place where it is easy to fall asleep: ? Put up shades or special blackout curtains to block light from  outside. ? Use a white noise machine to block noise. ? Keep the temperature cool.  Exercise regularly as directed by your health care provider. Avoid exercising right before bedtime.  Use relaxation techniques to manage stress. Ask your health care provider to suggest some techniques that may work well for you. These may include: ? Breathing exercises. ? Routines to release muscle tension. ? Visualizing peaceful scenes.  Cut back on alcohol, caffeinated beverages, and cigarettes, especially close to bedtime. These can disrupt your sleep.  Do not overeat or eat spicy foods right before bedtime. This can lead to digestive discomfort that can make it hard for you to sleep.  Limit screen use before bedtime. This includes: ? Watching TV. ? Using your smartphone, tablet, and computer.  Stick to a routine. This can help you fall asleep faster. Try to do a quiet activity, brush your teeth, and go to bed at the same time each night.  Get out of bed if you are still awake after 15 minutes of trying to sleep. Keep the lights down, but try reading or doing a quiet activity. When you feel sleepy, go back to  bed.  Make sure that you drive carefully. Avoid driving if you feel very sleepy.  Keep all follow-up appointments as directed by your health care provider. This is important. Contact a health care provider if:  You are tired throughout the day or have trouble in your daily routine due to sleepiness.  You continue to have sleep problems or your sleep problems get worse. Get help right away if:  You have serious thoughts about hurting yourself or someone else. This information is not intended to replace advice given to you by your health care provider. Make sure you discuss any questions you have with your health care provider. Document Released: 03/05/2000 Document Revised: 08/08/2015 Document Reviewed: 12/07/2013 Elsevier Interactive Patient Education  2018 [employer].  Fatigue Fatigue is feeling tired all of the time, a lack of energy, or a lack of motivation. Occasional or mild fatigue is often a normal response to activity or life in general. However, long-lasting (chronic) or extreme fatigue may indicate an underlying medical condition. Follow these instructions at home: Watch your fatigue for any changes. The following actions may help to lessen any discomfort you are feeling:  Talk to your health care provider about how much sleep you need each night. Try to get the required amount every night.  Take medicines only as directed by your health care provider.  Eat a healthy and nutritious diet. Ask your health care provider if you need help changing your diet.  Drink enough fluid to keep your urine clear or pale yellow.  Practice ways of relaxing, such as yoga, meditation, massage therapy, or acupuncture.  Exercise regularly.  Change situations that cause you stress. Try to keep your work and personal routine reasonable.  Do not abuse illegal drugs.  Limit alcohol intake to no more than 1 drink per day for nonpregnant women and 2 drinks per day for men. One drink equals 12 ounces of beer, 5 ounces of wine, or 1 ounces of hard liquor.  Take a multivitamin, if directed by your health care provider.  Contact a health care provider if:  Your fatigue does not get better.  You have a fever.  You have unintentional weight loss or gain.  You have headaches.  You have difficulty: ? Falling asleep. ? Sleeping throughout the night.  You feel angry, guilty, anxious, or sad.  You are unable to have a bowel movement (constipation).  You skin is dry.  Your legs or another part of your body is swollen. Get help right away if:  You feel confused.  Your vision is blurry.  You feel faint or pass out.  You have a severe headache.  You have severe abdominal, pelvic, or back pain.  You have chest pain, shortness of breath, or an  irregular or fast heartbeat.  You are unable to urinate or you urinate less than normal.  You develop abnormal bleeding, such as bleeding from the rectum, vagina, nose, lungs, or nipples.  You vomit blood.  You have thoughts about harming yourself or committing suicide.  You are worried that you might harm someone else. This information is not intended to replace advice given to you by your health care provider. Make sure you discuss any questions you have with your health care provider. Document Released: 01/03/2007 Document Revised: 08/14/2015 Document Reviewed: 07/10/2013 Elsevier Interactive Patient Education  [employer].

## 2017-03-23 NOTE — Progress Notes (Signed)
Chief Complaint  Patient presents with  . Establish Care   Establish care former PCP Dr .Terri Piedra 1. She reports diffuse joints and muscles and bone hurting worse x 2 weeks and hard to move in the am. Overall noticed sxs x 6 months She feels her bones are cracking and has neck and mid back pain and hands and feet hurt. She reports FH arthritis. Exercise makes pain worse and she likes to exercise and this affects  2. She c/o insomnia x 6 months and sleeps x 6 hours but wakes up 3x per night and she is having short term memory loss. PHQ 9 score 3 today  3. She reports A1C was 6.5 on recent labs with OB/GYN and B12 lab but unable to see B12 lab  4. She has left back knot golf ball size which feels harder and increased in size had x 1 year no pain except for today painful to touch 5. Reviewed labs from 03/09/17 [facility] [place] in [place] vit D 26 low, +BV, TSH 2.53, blood cts normal, liver kidneys, normal, TC 231, HDL 115, TGs 57, LDL 102, pap 05/17/16 neg and neg HPV    Review of Systems  Constitutional: Negative for weight loss.  Eyes:       +trouble seeing   Respiratory: Positive for shortness of breath.   Cardiovascular: Positive for chest pain and leg swelling.  Gastrointestinal: Negative for abdominal pain.  Genitourinary:       +recently tx'ed for BV  Musculoskeletal: Positive for back pain, joint pain, myalgias and neck pain.       +right jaw cracking and pain   Skin:       +lump on left lower back inc size   Neurological: Positive for sensory change and weakness.       +left lower leg with numbness/tingling  Psychiatric/Behavioral: Positive for memory loss. The patient has insomnia.    Past Medical History:  Diagnosis Date  . Allergy   . Bacterial vaginosis   . Cervicalgia   . Chicken pox   . Colon polyps   . Diverticulitis   . Hypertension   . Kidney stones   . Mid back pain   . UTI (urinary tract infection)    Past Surgical History:  Procedure Laterality Date    . APPENDECTOMY    . BREAST SURGERY    . COLONOSCOPY W/ POLYPECTOMY    . DILATION AND CURETTAGE OF UTERUS    . TONSILLECTOMY     Family History  Problem Relation Age of Onset  . Hypertension Mother   . Alcohol abuse Father   . Hypertension Father   . Colon cancer Father        77  . Stroke Maternal Grandmother   . Hypertension Maternal Grandmother   . Hypertension Maternal Grandfather   . Hypertension Paternal Grandmother   . Stroke Paternal Grandfather   . Hypertension Paternal Grandfather   . Colon cancer Other        60-70   Social History   Socioeconomic History  . Marital status: Divorced    Spouse name: Not on file  . Number of children: 0  . Years of education: 22  . Highest education level: Not on file  Social Needs  . Financial resource strain: Not on file  . Food insecurity - worry: Not on file  . Food insecurity - inability: Not on file  . Transportation needs - medical: Not on file  . Transportation needs - non-medical:  Not on file  Occupational History  . Occupation: Set designer  Tobacco Use  . Smoking status: Never Smoker  . Smokeless tobacco: Never Used  Substance and Sexual Activity  . Alcohol use: Yes    Alcohol/week: 0.0 oz    Comment: twice weekly  . Drug use: No  . Sexual activity: Yes    Birth control/protection: None  Other Topics Concern  . Not on file  Social History Narrative   Fun: Relax, swim, exercise    Denies religious beliefs effecting health care.   Denies abuse and feels safe at home.    Works in Nurse, learning disability and travels a lot    Never smoker/chew   Wine 1-2 glasses qhs    No illegal drugs    Current Meds  Medication Sig  . hydrochlorothiazide (MICROZIDE) 12.5 MG capsule TAKE 1 CAPSULE (12.5 MG TOTAL) BY MOUTH DAILY.  . naproxen (NAPROSYN) 500 MG tablet TAKE 1 TABLET (500 MG TOTAL) BY MOUTH 2 (TWO) TIMES DAILY WITH A MEAL.  . [DISCONTINUED] fluticasone (FLONASE) 50 MCG/ACT nasal spray Place 2 sprays into both  nostrils daily.  . [DISCONTINUED] omeprazole (PRILOSEC) 20 MG capsule Take 1 capsule (20 mg total) by mouth daily.   Current Facility-Administered Medications for the 03/23/17 encounter (Office Visit) with McLean-Scocuzza, Nino Glow, MD  Medication  . 0.9 %  sodium chloride infusion   Allergies  Allergen Reactions  . Penicillins     Fever/hives, happened as a child    No results found for this or any previous visit (from the past 2160 hour(s)). Objective  Body mass index is 23.43 kg/m. Wt Readings from Last 3 Encounters:  03/23/17 132 lb 4 oz (60 kg)  09/30/16 123 lb 12.8 oz (56.2 kg)  05/17/16 124 lb (56.2 kg)   Temp Readings from Last 3 Encounters:  03/23/17 97.7 F (36.5 C) (Oral)  09/30/16 98.1 F (36.7 C) (Oral)  05/17/16 98.3 F (36.8 C) (Oral)   BP Readings from Last 3 Encounters:  03/23/17 (!) 130/100  09/30/16 (!) 142/92  05/17/16 (!) 128/96   Pulse Readings from Last 3 Encounters:  03/23/17 66  09/30/16 84  05/17/16 76   O2 sat 99% room air   Physical Exam  Constitutional: She is oriented to person, place, and time and well-developed, well-nourished, and in no distress.  HENT:  Head: Normocephalic and atraumatic.  Mouth/Throat: Oropharynx is clear and moist and mucous membranes are normal.  Eyes: Conjunctivae are normal. Pupils are equal, round, and reactive to light.  Cardiovascular: Normal rate, regular rhythm and normal heart sounds.  Pulmonary/Chest: Effort normal and breath sounds normal.    Abdominal: Soft. Bowel sounds are normal. There is tenderness in the right upper quadrant.  RUQ RMQ ttp mild to mod   Musculoskeletal: She exhibits tenderness.       Right shoulder: She exhibits tenderness.       Left shoulder: She exhibits tenderness.       Cervical back: She exhibits tenderness and pain.       Back:  Neurological: She is alert and oriented to person, place, and time. Gait normal. Gait normal.  Skin: Skin is warm and dry.       Psychiatric: Mood, memory, affect and judgment normal.  Nursing note and vitals reviewed.   Assessment   1. Fatigue and Arthralgia and myalgia r/o RA (esp neck and mid back pain), autoimmune also in ddx is fibromyalgia PHQ 9 score 3 today  2. Insomnia  3. Vit  D def  4. H/o HTN  5. H/o elevated A1C 6.5 need to see records  6. ? Lipoma vs other to left lower back  7. RUQ/RMQ ab pain  8. HLD see HPI recent 03/09/17 lipid panel  9. HM 10. Left lower leg foot numbness/tingling   Plan  1. CMET, CBC nl 03/09/17, check UA today, check Ck today, check T4 had nl TSH 2.53 03/09/17 Consider cymbalta if w/u is negative to tx fibromyalgia if r/o other etiology  Xray neck and mid back  2. Disc melatonin 3-6 mg qhs 1 hour before bed  She has trouble staying asleep if this doesn't work consider sleep Tx  3. rec take 5000 IU vit D3 qd  4. Takes hct 12.5 mg prn will advise pt at f/u needs to take qd diastolic elevated today  5. Get records OB/GYN A1C and B12 per pt they were abnormal unable to see in system  Consider repeat A1C In 3 months if abnormal  6. Image CT/MRI disc with radiology who rec CT with contrast ab/pelvis will cancel [place] ab complete  7. [place] ab complete to w/u will cancel see above  8. Disc healthy diet already exercises freq Will give info about cholesterol at f/u  9. Had flu shot 2 weeks ago Tdap rec will give info at f/u  Check hep B status  Per pt been checked hep C with OB/GYN need to get record  Declines HIV testing   Never smoker  Pap had 05/19/16 neg except BV neg HPV f/u OB/GYN [place] 05/19/18  H/o hyperplastic polyps colonoscopy had 01/05/16  Need to get records Mammogram had with OB/GYN   Check labs hep B, UA, ck, T4, RA w/u,   Congratulated on never smoker/chew, drinks 1-2 glasses wine qhs  Consider MMSE at f/u for memory issues PHQ 9 score 3 today.   Provider: Dr. Olivia Mackie McLean-Scocuzza-[facility]

## 2017-03-24 LAB — ANA: ANA: NEGATIVE

## 2017-03-24 LAB — HEPATITIS B CORE ANTIBODY, TOTAL: Hep B Core Total Ab: NONREACTIVE

## 2017-03-24 LAB — HEPATITIS B SURFACE ANTIGEN: Hepatitis B Surface Ag: NONREACTIVE

## 2017-03-24 LAB — CYCLIC CITRUL PEPTIDE ANTIBODY, IGG: Cyclic Citrullin Peptide Ab: <16 UNITS

## 2017-03-24 LAB — RHEUMATOID FACTOR: Rhuematoid fact SerPl-aCnc: <14 IU/mL (ref <14)

## 2017-03-24 LAB — HEPATITIS B SURFACE ANTIBODY, QUANTITATIVE: Hepatitis B-Post: <5 m[IU]/mL — ABNORMAL LOW (ref >=10)

## 2017-03-24 NOTE — Addendum Note (Signed)
Addended by: Orland Mustard on: 03/24/2017 10:34 PM   Modules accepted: Orders

## 2017-03-25 ENCOUNTER — Ambulatory Visit: Payer: No Typology Code available for payment source | Admitting: Nurse Practitioner

## 2017-03-28 ENCOUNTER — Telehealth: Payer: Self-pay | Admitting: Internal Medicine

## 2017-03-28 ENCOUNTER — Other Ambulatory Visit: Payer: Self-pay

## 2017-03-28 NOTE — Telephone Encounter (Signed)
Please advise 

## 2017-03-28 NOTE — Telephone Encounter (Signed)
Patient is out of town and requesting prescriptions to be sent to pharmacy where she is, she transferred care and last OV 03/23/17.

## 2017-03-28 NOTE — Telephone Encounter (Signed)
Copied from [place]. Topic: Quick Communication - Rx Refill/Question >> Mar 28, 2017  1:21 PM Scherrie Gerlach wrote: Has the patient contacted their pharmacy? {no Pt out of town and this is a new script from transferring care.  Forgot to ask the dr at visit Pt needs hydrochlorothiazide (MICROZIDE) 12.5 MG capsule naproxen (NAPROSYN) 500 MG tablet  [facility]. [place] Phone: [phone]

## 2017-03-29 ENCOUNTER — Telehealth: Payer: Self-pay

## 2017-03-29 ENCOUNTER — Telehealth: Payer: Self-pay | Admitting: Internal Medicine
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
## 2017-03-29 NOTE — Telephone Encounter (Signed)
Copied from [place] [phone]. Topic: Referral - Question >> Mar 29, 2017  8:52 AM Synthia Innocent wrote: Reason for CRM: Per patients insurance, appt through [facility] needs to be canceled. Insurance has scheduled with [facility].  The patient's appointment has been canceled.

## 2017-04-04 ENCOUNTER — Telehealth: Payer: Self-pay | Admitting: Internal Medicine

## 2017-04-04 NOTE — Telephone Encounter (Signed)
[facility]CTZ: LR:01/24/17 #30 1 refill OV: 03/23/17  [place] LR: 11/08/16 #30 tab 0 RF    See telephone encounter 03/28/17

## 2017-04-04 NOTE — Telephone Encounter (Signed)
Copied from [place] [id number]. Topic: Quick Communication - Rx Refill/Question >> Apr 04, 2017 11:15 AM Cecelia Byars, NT wrote: Medication: Hydroclorothiazide , and and naproxen Has the patient contacted their pharmacy? {yes  (Agent: If no, request that the patient contact the pharmacy for the refill.) [facility]  [place]: Please be advised that RX refills may take up to 3 business days. We ask that you follow-up with your pharmacy.  Patient has called on 03/28/17 please advise, she is also out of the naproxen , please advise

## 2017-04-04 NOTE — Telephone Encounter (Signed)
Please advise 

## 2017-04-05 ENCOUNTER — Other Ambulatory Visit: Payer: Self-pay | Admitting: Internal Medicine

## 2017-04-05 ENCOUNTER — Other Ambulatory Visit: Payer: Self-pay | Admitting: Nurse Practitioner

## 2017-04-05 DIAGNOSIS — M549 Dorsalgia, unspecified: Secondary | ICD-10-CM

## 2017-04-05 DIAGNOSIS — I10 Essential (primary) hypertension: Secondary | ICD-10-CM

## 2017-04-05 MED ORDER — NAPROXEN 500 MG PO TABS: 500.0000 mg | ORAL_TABLET | Freq: Two times a day (BID) | ORAL | 2 refills | Status: DC

## 2017-04-05 MED ORDER — NAPROXEN 500 MG PO TABS: 500.0000 mg | ORAL_TABLET | Freq: Two times a day (BID) | ORAL | 0 refills | Status: DC

## 2017-04-05 MED ORDER — HYDROCHLOROTHIAZIDE 12.5 MG PO CAPS: ORAL_CAPSULE | ORAL | 0 refills | Status: DC

## 2017-04-06 NOTE — Telephone Encounter (Signed)
Does pt want to see ortho spine for chronic neck pain? I rec Dr. Lynann Bologna in [place]  Thanks [employer]

## 2017-04-07 NOTE — Telephone Encounter (Signed)
Patient would like to go ahead with referral 

## 2017-04-08 ENCOUNTER — Other Ambulatory Visit: Payer: Self-pay | Admitting: Internal Medicine

## 2017-04-08 DIAGNOSIS — M542 Cervicalgia: Secondary | ICD-10-CM

## 2017-04-11 ENCOUNTER — Ambulatory Visit (HOSPITAL_COMMUNITY): Payer: No Typology Code available for payment source

## 2017-04-13 ENCOUNTER — Ambulatory Visit: Payer: No Typology Code available for payment source | Admitting: Internal Medicine

## 2017-04-13 ENCOUNTER — Ambulatory Visit: Payer: No Typology Code available for payment source

## 2017-04-13 ENCOUNTER — Encounter: Payer: Self-pay | Admitting: Internal Medicine

## 2017-04-13 VITALS — BP 118/88 | HR 64 | Temp 98.2°F | Resp 16 | Ht 63.0 in | Wt 129.4 lb

## 2017-04-13 DIAGNOSIS — I10 Essential (primary) hypertension: Secondary | ICD-10-CM

## 2017-04-13 DIAGNOSIS — M25561 Pain in right knee: Secondary | ICD-10-CM

## 2017-04-13 DIAGNOSIS — M2550 Pain in unspecified joint: Secondary | ICD-10-CM

## 2017-04-13 DIAGNOSIS — M542 Cervicalgia: Secondary | ICD-10-CM

## 2017-04-13 DIAGNOSIS — M25562 Pain in left knee: Secondary | ICD-10-CM

## 2017-04-13 DIAGNOSIS — E559 Vitamin D deficiency, unspecified: Secondary | ICD-10-CM

## 2017-04-13 DIAGNOSIS — M549 Dorsalgia, unspecified: Secondary | ICD-10-CM

## 2017-04-13 DIAGNOSIS — G8929 Other chronic pain: Secondary | ICD-10-CM

## 2017-04-13 DIAGNOSIS — E785 Hyperlipidemia, unspecified: Secondary | ICD-10-CM

## 2017-04-13 DIAGNOSIS — R222 Localized swelling, mass and lump, trunk: Secondary | ICD-10-CM

## 2017-04-13 MED ORDER — NAPROXEN 500 MG PO TABS: 500.0000 mg | ORAL_TABLET | Freq: Two times a day (BID) | ORAL | 1 refills | Status: DC

## 2017-04-13 MED ORDER — PREDNISONE 20 MG PO TABS: 40.0000 mg | ORAL_TABLET | Freq: Every day | ORAL | 0 refills | Status: DC

## 2017-04-13 MED ORDER — HYDROCHLOROTHIAZIDE 12.5 MG PO CAPS: ORAL_CAPSULE | ORAL | 3 refills | Status: DC

## 2017-04-13 NOTE — Patient Instructions (Addendum)
Try Prednisone 40 mg 2 pills in am with food  Naproxen prn  Xray back today mid back today  Refer to PT  F/u in in 6 weeks sooner if needed    Muscle Pain, Adult Muscle pain (myalgia) may be mild or severe. In most cases, the pain lasts only a short time and it goes away without treatment. It is normal to feel some muscle pain after starting a workout program. Muscles that have not been used often will be sore at first. Muscle pain may also be caused by many other things, including:  Overuse or muscle strain, especially if you are not in shape. This is the most common cause of muscle pain.  Injury.  Bruises.  Viruses, such as the flu.  Infectious diseases.  A chronic condition that causes muscle tenderness, fatigue, and headache (fibromyalgia).  A condition, such as lupus, in which the body's disease-fighting system attacks other organs in the body (autoimmune or rheumatologic diseases).  Certain drugs, including ACE inhibitors and statins.  To diagnose the cause of your muscle pain, your health care provider will do a physical exam and ask questions about the pain and when it began. If you have not had muscle pain for very long, your health care provider may want to wait before doing much testing. If your muscle pain has lasted a long time, your health care provider may want to run tests right away. In some cases, this may include tests to rule out certain conditions or illnesses. Treatment for muscle pain depends on the cause. Home care is often enough to relieve muscle pain. Your health care provider may also prescribe anti-inflammatory medicine. Follow these instructions at home: Activity  If overuse is causing your muscle pain: ? Slow down your activities until the pain goes away. ? Do regular, gentle exercises if you are not usually active. ? Warm up before exercising. Stretch before and after exercising. This can help lower the risk of muscle pain.  Do not continue working  out if the pain is very bad. Bad pain could mean that you have injured a muscle. Managing pain and discomfort   If directed, apply ice to the sore muscle: ? Put ice in a plastic bag. ? Place a towel between your skin and the bag. ? Leave the ice on for 20 minutes, 2-3 times a day.  You may also alternate between applying ice and applying heat as told by your health care provider. To apply heat, use the heat source that your health care provider recommends, such as a moist heat pack or a heating pad. ? Place a towel between your skin and the heat source. ? Leave the heat on for 20-30 minutes. ? Remove the heat if your skin turns bright red. This is especially important if you are unable to feel pain, heat, or cold. You may have a greater risk of getting burned. Medicines  Take over-the-counter and prescription medicines only as told by your health care provider.  Do not drive or use heavy machinery while taking prescription pain medicine. Contact a health care provider if:  Your muscle pain gets worse and medicines do not help.  You have muscle pain that lasts longer than 3 days.  You have a rash or fever along with muscle pain.  You have muscle pain after a tick bite.  You have muscle pain while working out, even though you are in good physical condition.  You have redness, soreness, or swelling along with muscle  pain.  You have muscle pain after starting a new medicine or changing the dose of a medicine. Get help right away if:  You have trouble breathing.  You have trouble swallowing.  You have muscle pain along with a stiff neck, fever, and vomiting.  You have severe muscle weakness or cannot move part of your body. This information is not intended to replace advice given to you by your health care provider. Make sure you discuss any questions you have with your health care provider. Document Released: 01/28/2006 Document Revised: 09/26/2015 Document Reviewed:  07/29/2015 Elsevier Interactive Patient Education  2018 [employer].  Back Pain, Adult Many adults have back pain from time to time. Common causes of back pain include:  A strained muscle or ligament.  Wear and tear (degeneration) of the spinal disks.  Arthritis.  A hit to the back.  Back pain can be short-lived (acute) or last a long time (chronic). A physical exam, lab tests, and imaging studies may be done to find the cause of your pain. Follow these instructions at home: Managing pain and stiffness  Take over-the-counter and prescription medicines only as told by your health care provider.  If directed, apply heat to the affected area as often as told by your health care provider. Use the heat source that your health care provider recommends, such as a moist heat pack or a heating pad. ? Place a towel between your skin and the heat source. ? Leave the heat on for 20-30 minutes. ? Remove the heat if your skin turns bright red. This is especially important if you are unable to feel pain, heat, or cold. You have a greater risk of getting burned.  If directed, apply ice to the injured area: ? Put ice in a plastic bag. ? Place a towel between your skin and the bag. ? Leave the ice on for 20 minutes, 2-3 times a day for the first 2-3 days. Activity  Do not stay in bed. Resting more than 1-2 days can delay your recovery.  Take short walks on even surfaces as soon as you are able. Try to increase the length of time you walk each day.  Do not sit, drive, or stand in one place for more than 30 minutes at a time. Sitting or standing for long periods of time can put stress on your back.  Use proper lifting techniques. When you bend and lift, use positions that put less stress on your back: ? Hayden your knees. ? Keep the load close to your body. ? Avoid twisting.  Exercise regularly as told by your health care provider. Exercising will help your back heal faster. This also helps  prevent back injuries by keeping muscles strong and flexible.  Your health care provider may recommend that you see a physical therapist. This person can help you come up with a safe exercise program. Do any exercises as told by your physical therapist. Lifestyle  Maintain a healthy weight. Extra weight puts stress on your back and makes it difficult to have good posture.  Avoid activities or situations that make you feel anxious or stressed. Learn ways to manage anxiety and stress. One way to manage stress is through exercise. Stress and anxiety increase muscle tension and can make back pain worse. General instructions  Sleep on a firm mattress in a comfortable position. Try lying on your side with your knees slightly bent. If you lie on your back, put a pillow under your knees.  Follow  your treatment plan as told by your health care provider. This may include: ? Cognitive or behavioral therapy. ? Acupuncture or massage therapy. ? Meditation or yoga. Contact a health care provider if:  You have pain that is not relieved with rest or medicine.  You have increasing pain going down into your legs or buttocks.  Your pain does not improve in 2 weeks.  You have pain at night.  You lose weight.  You have a fever or chills. Get help right away if:  You develop new bowel or bladder control problems.  You have unusual weakness or numbness in your arms or legs.  You develop nausea or vomiting.  You develop abdominal pain.  You feel faint. Summary  Many adults have back pain from time to time. A physical exam, lab tests, and imaging studies may be done to find the cause of your pain.  Use proper lifting techniques. When you bend and lift, use positions that put less stress on your back.  Take over-the-counter and prescription medicines and apply heat or ice as directed by your health care provider. This information is not intended to replace advice given to you by your health care  provider. Make sure you discuss any questions you have with your health care provider. Document Released: 03/08/2005 Document Revised: 04/12/2016 Document Reviewed: 04/12/2016 Elsevier Interactive Patient Education  [employer].

## 2017-04-13 NOTE — Progress Notes (Addendum)
Chief Complaint  Patient presents with  . Follow-up   Follow up  1. CT scan ab/pelvis had with [facility] 04/11/17 to work up back lesion small right kidney cyst no comment on left lower back lesion will call radiology with [facility] and also no evidence of bone pain but pt c/o MSK/bone pain esp neck, mid back, b/l knees. Autoimmune w/u last visit negative.  Reviewed Xray mid back 03/23/17 with mild dextroscoliosis lower thoracic spine mild T6/7 T7/8 disc space narrowing. Reviewed MRI C spine 06/2015 with C4/5 disc degneration, disc space narrowing and diffuse uncinate spurring. Mild to moderate foraminal stenosis b/l mild spinal stenosis, disc space narrowing and diffuse uncinate spurring left>right, marked left foraminal encroachment due to spurring mild right forminal narrowing. Mild spinal stenosis C5/6. C6/7 mild disc degeneration and spurring. Small central disc protrusion. During this time pt reports she was referred to [facility] in [place] Dr. Loretha Stapler had traction PT in this office which helped and did not have any steroid shots or surgery. She has tried several chiropractors which did not help pain and made pain worse. She is an avid runner and thinks sx's could be related to running overtime but denies h/o trauma.  For neck pain this time I referred her to [facility] but this office does not take her insurance.  She c/o worsening pain with lateral rotation b/l of neck.  She feels like neck pain also affects right side of face and jaw.  She c/o mid back pain worse with lying on her back and feels like pain is pushing through her chest. Revied mid back Xray form 03/23/17. She feels like she hears her bones "crunching". For pain she uses Naproxen sparingly which helps    2. C/o b/l knee pain worse on sides of knees than in the middle.   3. HTN controlled wants 90 day supply of HCTZ  4. Sleep improved with melatonin and she feels like she is getting a deeper sleep. Last visit reviewed note and PHQ 9 was  3    Review of Systems  Constitutional: Negative for weight loss.  HENT: Negative for hearing loss.   Respiratory: Negative for shortness of breath.   Cardiovascular: Negative for chest pain.  Musculoskeletal: Positive for back pain, joint pain, myalgias and neck pain.  Skin:       +mass to left lower back   Psychiatric/Behavioral:       Sleep improved    Past Medical History:  Diagnosis Date  . Allergy   . Bacterial vaginosis   . Cervicalgia   . Chicken pox   . Colon polyps   . Diverticulitis   . Hypertension   . Kidney stones   . Mid back pain   . UTI (urinary tract infection)    Past Surgical History:  Procedure Laterality Date  . APPENDECTOMY    . BREAST SURGERY    . COLONOSCOPY W/ POLYPECTOMY    . DILATION AND CURETTAGE OF UTERUS    . TONSILLECTOMY     Family History  Problem Relation Age of Onset  . Hypertension Mother   . Alcohol abuse Father   . Hypertension Father   . Colon cancer Father        12  . Stroke Maternal Grandmother   . Hypertension Maternal Grandmother   . Hypertension Maternal Grandfather   . Hypertension Paternal Grandmother   . Stroke Paternal Grandfather   . Hypertension Paternal Grandfather   . Colon cancer Other  60-70   Social History   Socioeconomic History  . Marital status: Divorced    Spouse name: Not on file  . Number of children: 0  . Years of education: 24  . Highest education level: Not on file  Social Needs  . Financial resource strain: Not on file  . Food insecurity - worry: Not on file  . Food insecurity - inability: Not on file  . Transportation needs - medical: Not on file  . Transportation needs - non-medical: Not on file  Occupational History  . Occupation: Set designer  Tobacco Use  . Smoking status: Never Smoker  . Smokeless tobacco: Never Used  Substance and Sexual Activity  . Alcohol use: Yes    Alcohol/week: 0.0 oz    Comment: twice weekly  . Drug use: No  . Sexual activity: Yes     Birth control/protection: None  Other Topics Concern  . Not on file  Social History Narrative   Fun: Relax, swim, exercise    Denies religious beliefs effecting health care.   Denies abuse and feels safe at home.    Works in Nurse, learning disability and travels a lot    Never smoker/chew   Wine 1-2 glasses qhs    No illegal drugs    Current Meds  Medication Sig  . hydrochlorothiazide (MICROZIDE) 12.5 MG capsule TAKE 1 CAPSULE (12.5 MG TOTAL) BY MOUTH DAILY.  . naproxen (NAPROSYN) 500 MG tablet Take 1 tablet (500 mg total) by mouth 2 (two) times daily with a meal.  . [DISCONTINUED] hydrochlorothiazide (MICROZIDE) 12.5 MG capsule TAKE 1 CAPSULE (12.5 MG TOTAL) BY MOUTH DAILY.  . [DISCONTINUED] naproxen (NAPROSYN) 500 MG tablet Take 1 tablet (500 mg total) by mouth 2 (two) times daily with a meal.   Allergies  Allergen Reactions  . Penicillins     Fever/hives, happened as a child    Recent Results (from the past 2160 hour(s))  T4, free     Status: None   Collection Time: 03/23/17  9:58 AM  Result Value Ref Range   Free T4 0.68 0.60 - 1.60 ng/dL    Comment: Specimens from patients who are undergoing biotin therapy and /or ingesting biotin supplements may contain high levels of biotin.  The higher biotin concentration in these specimens interferes with this Free T4 assay.  Specimens that contain high levels  of biotin may cause false high results for this Free T4 assay.  Please interpret results in light of the total clinical presentation of the patient.    Urinalysis, Routine w reflex microscopic     Status: None   Collection Time: 03/23/17  9:58 AM  Result Value Ref Range   Color, Urine YELLOW Yellow;Lt. Yellow   APPearance CLEAR Clear   Specific Gravity, Urine 1.020 1.000 - 1.030   pH 6.0 5.0 - 8.0   Total Protein, Urine NEGATIVE Negative   Urine Glucose NEGATIVE Negative   Ketones, ur NEGATIVE Negative   Bilirubin Urine NEGATIVE Negative   Hgb urine dipstick NEGATIVE Negative    Urobilinogen, UA 0.2 0.0 - 1.0   Leukocytes, UA NEGATIVE Negative   Nitrite NEGATIVE Negative   WBC, UA 0-2/hpf 0-2/hpf   RBC / HPF none seen 0-2/hpf   Squamous Epithelial / LPF Rare(0-4/hpf) Rare(0-4/hpf)  CK (Creatine Kinase)     Status: None   Collection Time: 03/23/17  9:58 AM  Result Value Ref Range   Total CK 61 7 - 177 U/L  Rheumatoid Factor     Status:  None   Collection Time: 03/23/17  9:58 AM  Result Value Ref Range   Rhuematoid fact SerPl-aCnc <14 <14 IU/mL  Antinuclear Antib (ANA)     Status: None   Collection Time: 03/23/17  9:58 AM  Result Value Ref Range   Anit Nuclear Antibody(ANA) NEGATIVE NEGATIVE    Comment: ANA IFA is a first line screen for detecting the presence of up to approximately 150 autoantibodies in various autoimmune diseases. A negative ANA IFA result suggests ANA-associated autoimmune diseases are not present at this time. . Visit Physician FAQs for interpretation of all antibodies in the [place], prevalence, and association with diseases at http://education.[facility].com/ POE/UMP536 .   Sedimentation rate     Status: None   Collection Time: 03/23/17  9:58 AM  Result Value Ref Range   Sed Rate 2 0 - 30 mm/hr  C-reactive protein     Status: Abnormal   Collection Time: 03/23/17  9:58 AM  Result Value Ref Range   CRP <0.1 (L) 0.5 - 14.4 mg/dL  Cyclic citrul peptide antibody, IgG     Status: None   Collection Time: 03/23/17  9:58 AM  Result Value Ref Range   Cyclic Citrullin Peptide Ab <16 UNITS    Comment: Reference Range Negative:            <20 Weak Positive:       20-39 Moderate Positive:   40-59 Strong Positive:     >59 .   Hepatitis B surface antibody     Status: Abnormal   Collection Time: 03/23/17  9:58 AM  Result Value Ref Range   Hepatitis B-Post <5 (L) > OR = 10 mIU/mL    Comment: . Patient does not have immunity to hepatitis B virus. . For additional information, please refer  to http://education.[facility].com/faq/FAQ105 (This link is being provided for informational/ educational purposes only).   Hepatitis B surface antigen     Status: None   Collection Time: 03/23/17  9:58 AM  Result Value Ref Range   Hepatitis B Surface Ag NON-REACTIVE NON-REACTI  Hepatitis B Core Antibody, total     Status: None   Collection Time: 03/23/17  9:58 AM  Result Value Ref Range   Hep B Core Total Ab NON-REACTIVE NON-REACTI   Objective  Body mass index is 22.92 kg/m. Wt Readings from Last 3 Encounters:  04/13/17 129 lb 6 oz (58.7 kg)  03/23/17 132 lb 4 oz (60 kg)  09/30/16 123 lb 12.8 oz (56.2 kg)   Temp Readings from Last 3 Encounters:  04/13/17 98.2 F (36.8 C) (Oral)  03/23/17 97.7 F (36.5 C) (Oral)  09/30/16 98.1 F (36.7 C) (Oral)   BP Readings from Last 3 Encounters:  04/13/17 118/88  03/23/17 (!) 130/100  09/30/16 (!) 142/92   Pulse Readings from Last 3 Encounters:  04/13/17 64  03/23/17 66  09/30/16 84   O2 sat room air 98%  Physical Exam  Constitutional: She is oriented to person, place, and time and well-developed, well-nourished, and in no distress. Vital signs are normal.  HENT:  Head: Normocephalic and atraumatic.  Mouth/Throat: Oropharynx is clear and moist and mucous membranes are normal.  Eyes: Conjunctivae are normal. Pupils are equal, round, and reactive to light.  Cardiovascular: Normal rate, regular rhythm and normal heart sounds.  Pulmonary/Chest: Effort normal and breath sounds normal.  Musculoskeletal:       Right knee: She exhibits no swelling and no bony tenderness. Tenderness found. Medial joint line and lateral joint line tenderness  noted. No patellar tendon tenderness noted.       Left knee: She exhibits no swelling and no bony tenderness. Tenderness found. Medial joint line and lateral joint line tenderness noted. No patellar tendon tenderness noted.       Cervical back: She exhibits tenderness. She exhibits normal range  of motion.       Back:  Pain with lateral rotation neck   Neurological: She is alert and oriented to person, place, and time. Gait normal. Gait normal.  Skin: Skin is warm, dry and intact.  Psychiatric: Mood, memory, affect and judgment normal.  Nursing note and vitals reviewed.   Assessment   1. Cervicalgia with abnormal MRI 06/2015 see HPI, mid back pain with abnormal Xray 03/2017 see HPI  2. B/l knee pain  3. HTN controlled/HLD uncontrolled  4. Insomnia  5. Left lower back mass  6. HM 7. Vit D def 26 02/2017  Plan  1.  03/23/17 RA w/u neg ANA, RF, ESR, CRP, anti CCP, CK normal Dr. Carlos American office did not take her insurance. Prev been to [facility] in [place] Dr. Loretha Stapler had PT in the office which helped will refer back there for PT 1st b/f sees [facility] [phone] Consider repeat MRI C spine and do MRI T spine  Trial of oral steroids for now 40 mg x 1 week  Prn Naproxen  Consider referral to NS in future as well as consider rheumatology in future for autoimmune w/u neg but diffuse pain  Consider cymbalta trial and and ddx fibromyalgia for myalgia/arthralgia   2. Trial of meds as above if continues consider referral to ortho/SM in [place] 3. Rx refill HCTZ 12.5 mg qd  Repeat lipid in future sl elevated 02/2017   4. Melatonin helping sleep 5. Will need to review CT ab/pelvis with [facility] as they did not comment on left back pain on imaging  6.  Had flu  rec Tdap and hep B vaccine   Consider check hep C status in future   Of note on note 03/23/17: Reviewed labs from 03/09/17 [facility] in [place] vit D 26 low, +BV, TSH 2.53 nl and free T4 checked  03/23/17 normal; blood cts normal, liver kidneys, normal, TC 231, HDL 115, TGs 57, LDL 102, pap 05/17/16 neg and neg HPV  Consider check A1C in future h/o hyperglycemia  Still pending mammogram report had with OB/GYN requested records again  -no report but letter from 05/19/16 normal [place]  OB/GYN Colonoscopy 01/05/16 Dr. Hilarie Fredrickson diverticulosis/IH and FH colon cancer Will ask if she has had bone density with OB/GYN in the past   7. Pt on vit D3 per OB/GYN ? Dose will need to confirm in future pt unsure today   Obtained records [facility] Reviewed labs 03/13/17  Vit D 26  CBC normal  CMET normal  TSH 2.53 nl TC 231, HDL 115 TG 57, LDL 102  Pap 05/17/16 neg +BV neg HPV, GC/C  Provider: Dr. Olivia Mackie [facility]

## 2017-04-14 ENCOUNTER — Telehealth: Payer: Self-pay

## 2017-04-14 NOTE — Telephone Encounter (Signed)
Copied from [place] [id number]. Topic: Referral - Question >> Apr 14, 2017  3:35 PM Arletha Grippe wrote: Reason for CRM:Dr Cabbel's office called. They received referral for physical therapy only.  They do not do physical therapy at this office. Only the [place] office does physical therapy.  No cb needed

## 2017-04-15 ENCOUNTER — Telehealth: Payer: Self-pay | Admitting: *Deleted

## 2017-04-15 NOTE — Telephone Encounter (Signed)
lvm for pt to call back. Where would she like to go since they only do PT in [place] office.

## 2017-04-15 NOTE — Telephone Encounter (Signed)
Copied from [place]. Topic: Referral - Question >> Apr 15, 2017 11:38 AM Ahmed Prima L wrote: Patient would like her PT referral to go to [place] [facility] Dr Merwyn Katos [place].  Call back is [phone]

## 2017-04-16 NOTE — Telephone Encounter (Signed)
Pt wants PT referral and ortho referrals to go to [facility] and [facility] Dr. Jethro Poling [facility] neurosurgery PT referral please and Dr. Lynann Bologna will not take her insurance  For chronic neck pain, mid back pain  She would like a call back when this is done   Thanks [place]

## 2017-04-20 ENCOUNTER — Other Ambulatory Visit: Payer: Self-pay | Admitting: Internal Medicine

## 2017-04-20 DIAGNOSIS — M5134 Other intervertebral disc degeneration, thoracic region: Secondary | ICD-10-CM

## 2017-04-20 DIAGNOSIS — M5030 Other cervical disc degeneration, unspecified cervical region: Secondary | ICD-10-CM

## 2017-04-20 NOTE — Progress Notes (Signed)
Chronic pain neck and mid back pt does not want to try Cymbalta She would like MRI C and T spine will do 3rd week 04/2017 for chronic neck and mid back pain and referral to [facility] for evaluation and PT (see prior messages) -cancel referral Dr. Lynann Bologna he did not take her insurance   Will also send to [facility] to eval and possibly bx mass to left lower back not seen on CT scan and possibly bx vs reassure pt ? If lipoma.   [place]

## 2017-04-24 ENCOUNTER — Encounter: Payer: Self-pay | Admitting: Internal Medicine

## 2017-04-26 ENCOUNTER — Telehealth: Payer: Self-pay

## 2017-04-26 NOTE — Telephone Encounter (Signed)
Please advise 

## 2017-04-26 NOTE — Telephone Encounter (Signed)
Copied from [place] [phone]. Topic: Referral - Request >> Apr 26, 2017 12:53 PM Bea Graff, NT wrote: Reason for CRM: Pt needing a referral for her neck and spine. She would like to see Dr. Margarette Asal located at [facility]. Fax#: [phone]. [facility] of [facility]. This dr is covered under her insurance. Before pt can make an appt with them, her xrays that are on file with [facility] will need to be sent to their office.

## 2017-04-27 ENCOUNTER — Ambulatory Visit: Payer: No Typology Code available for payment source

## 2017-04-27 ENCOUNTER — Other Ambulatory Visit: Payer: No Typology Code available for payment source

## 2017-04-27 NOTE — Progress Notes (Signed)
Pre visit review using our clinic review tool, if applicable. No additional management support is needed unless otherwise documented below in the visit note. 

## 2017-04-27 NOTE — Telephone Encounter (Signed)
Referral already placed for ortho and PT pt prefers this location  Please place referral here  Send Xrays cervical, lumbar and old MRI cervical and lumbar if in epic   Thanks [place]

## 2017-05-02 ENCOUNTER — Other Ambulatory Visit: Payer: Self-pay | Admitting: Internal Medicine

## 2017-05-02 DIAGNOSIS — R222 Localized swelling, mass and lump, trunk: Secondary | ICD-10-CM

## 2017-05-05 ENCOUNTER — Ambulatory Visit: Payer: No Typology Code available for payment source

## 2017-05-05 ENCOUNTER — Telehealth: Payer: Self-pay | Admitting: Internal Medicine

## 2017-05-05 ENCOUNTER — Other Ambulatory Visit: Payer: No Typology Code available for payment source

## 2017-05-05 NOTE — Telephone Encounter (Signed)
Copied from [place] [phone]. Topic: Inquiry >> May 05, 2017  1:07 PM Oliver Pila B wrote: ADDRESSED TO RASHIDA  Reason for CRM: cindy called from [place], pt states that she wants to cancel the test and [facility] is wanting to cancel the case, contact Daniel @ [phone]

## 2017-05-05 NOTE — Telephone Encounter (Signed)
FYI

## 2017-05-20 NOTE — Telephone Encounter (Signed)
Kathy Riley with [facility] called to ask if the MRI needs to be rescheduled. They got a message the MRI was cancelled. Kathy Riley states they do not talk to the pt, and she doesn't know if the pt wants to cancel or not.That is why she called  This previous message incorrect, and Kathy Riley has been waiting for a call back. They will pre cert if they can get a day of service.  pre cert number: [id number] Kathy Riley would like a call back please. [phone]

## 2017-06-03 NOTE — Telephone Encounter (Signed)
Kathy Riley is calling back again to try and get an answer about this request.  This is still ongoing, and Kathy Riley would like someone to call her back so she can get an answer. Kathy Riley will cancel this case on 06/20/17 if she cannot get anyone to call her back.  Phone: [phone].  Please see my previous message

## 2017-06-22 ENCOUNTER — Telehealth: Payer: Self-pay | Admitting: Internal Medicine

## 2017-06-22 NOTE — Telephone Encounter (Signed)
Copied from [place]. Topic: General - Call Back - No Documentation >> Jun 22, 2017  2:25 PM Arletha Grippe wrote: Reason for CRM: pt is returning call to [place] - about Dr Sima Matas.  Pt says she is having a more immediate need about her short term memory.   Pt says that she needs to see neurologist. That is the priority for her. Cb is [phone]

## 2017-06-27 ENCOUNTER — Encounter: Payer: Self-pay | Admitting: Internal Medicine

## 2017-06-27 ENCOUNTER — Ambulatory Visit: Payer: No Typology Code available for payment source | Admitting: Internal Medicine

## 2017-06-27 VITALS — BP 138/86 | HR 66 | Temp 98.3°F | Ht 63.0 in | Wt 132.8 lb

## 2017-06-27 DIAGNOSIS — R413 Other amnesia: Secondary | ICD-10-CM

## 2017-06-27 DIAGNOSIS — I10 Essential (primary) hypertension: Secondary | ICD-10-CM

## 2017-06-27 NOTE — Patient Instructions (Addendum)
Ask OB/GYN to check B12 level  I will try to order MRI without contrast of your brain in [facility]  Dr. Phoebe Perch surgery/Dr. Bary Castilla surgery in [place]  We will try to order MRI if we are unable will do CT scan of brain F/u in 3-4 months sooner if needed   Transient Global Amnesia Transient global amnesia causes a sudden and temporary (transient) loss of memory (amnesia). While you may recall memories from your distant past, including being able to recognize people you know well, you may not recall things that happened more recently in the past days, months, or even year. A transient global amnesia episode does not last longer than 24 hours. Transient global amnesia does not affect your other brain functions. Your memory usually returns to normal after an episode is over. One episode of transient global amnesia does not make you more likely to have a stroke, a relapse, or other complications. What are the causes? The cause of this condition is not known. What increases the risk? Transient global amnesia is more likely to develop in people who:  Are 59-24 years old.  Have a history of migraine headaches.  What are the signs or symptoms? The main symptoms of this condition include:  The inability to remember recent events.  Asking repetitive questions about the situation and surroundings and not recalling the answers to these questions.  Other symptoms include:  Restlessness and nervousness.  Confusion.  Headaches.  Dizziness.  Nausea.  How is this diagnosed? Your health care provider may suspect transient global amnesia based on your symptoms. Your health care provider will do a physical exam. This may include a test to check your mental abilities (cognitive evaluation). You may also have imaging studies done to check your brain function. These may include:  Electroencephalography (EEG).  Diffusion-weighted imaging (DWI).  MRI.  How is this treated? There is  no treatment for this condition. An episode typically goes away on its own after a few hours. If you also have a seizure or migraine during an episode, you will receive treatment for these conditions. This may include medicines. Follow these instructions at home:  Take medicines only as directed by your health care provider.  Tell your family or friends that you have transient global amnesia. Ask them to help you avoid physical exertion, including sexual intercourse, swimming, and straining while holding your breath (Valsalva maneuver), until the episode passes. These are events that can bring on transient global amnesia attacks. Contact a health care provider if:  You have a migraine and it does not go away after you have followed your treatment plan for this condition.  You have a seizure for the first time, or a seizure that is different from seizures you normally have.  You experience transient global amnesia repeatedly. This information is not intended to replace advice given to you by your health care provider. Make sure you discuss any questions you have with your health care provider. Document Released: 04/15/2004 Document Revised: 11/10/2015 Document Reviewed: 11/21/2013 Elsevier Interactive Patient Education  2018 [employer].   Dementia Dementia means losing some of your brain ability. People with dementia may have problems with:  Memory.  Making decisions.  Behavior.  Speaking.  Thinking.  Solving problems.  Follow these instructions at home: Medicine  Take over-the-counter and prescription medicines only as told by your doctor.  Avoid taking medicines that can change how you think. These include pain or sleeping medicines. Lifestyle   Make healthy choices: ?  Be active as told by your doctor. ? Do not use any tobacco products, such as cigarettes, chewing tobacco, and e-cigarettes. If you need help quitting, ask your doctor. ? Eat a healthy diet. ? When you  get stressed, do something to help yourself relax. Your doctor can give you tips. ? Spend time with other people.  Drink enough fluid to keep your pee (urine) clear or pale yellow.  Make sure you get good sleep. Use these tips to help you get a good night's rest: ? Try not to take naps during the day. ? Keep your sleeping area dark and cool. ? In the few hours before you go to bed, try not to do any exercise. ? Try not to have foods and drinks with caffeine in the evening. General instructions  Talk with your doctor to figure out: ? What you need help with. ? What your safety needs are.  If you were given a bracelet that tracks your location, make sure to wear it.  Keep all follow-up visits as told by your doctor. This is important. Contact a doctor if:  You have any new problems.  You have problems with choking or swallowing.  You have any symptoms of a different sickness. Get help right away if:  You have a fever.  You feel mixed up (confused) or more mixed up than before.  You have new sleepiness.  You have sleepiness that gets worse.  You have a hard time staying awake.  You or your family members are worried for your safety. This information is not intended to replace advice given to you by your health care provider. Make sure you discuss any questions you have with your health care provider. Document Released: 02/19/2008 Document Revised: 08/14/2015 Document Reviewed: 12/04/2014 Elsevier Interactive Patient Education  2018 [employer]  1. Use "WARM" strategy.  W= write it down  A= associate it  R= repeat it  M= make a mental note  2.   You can keep a Social worker.  Use a 3-ring notebook with sections for the following: calendar, important names and phone numbers,  medications, doctors' names/phone numbers, lists/reminders, and a section to journal what you did  each day.   3.    Use a calendar to write appointments  down.  4.    Write yourself a schedule for the day.  This can be placed on the calendar or in a separate section of the Memory Notebook.  Keeping a  regular schedule can help memory.  5.    Use medication organizer with sections for each day or morning/evening pills.  You may need help loading it  6.    Keep a basket, or pegboard by the door.  Place items that you need to take out with you in the basket or on the pegboard.  You may also want to  include a message board for reminders.  7.    Use sticky notes.  Place sticky notes with reminders in a place where the task is performed.  For example: " turn off the  stove" placed by the stove, "lock the door" placed on the door at eye level, " take your medications" on  the bathroom mirror or by the place where you normally take your medications.  8.    Use alarms/timers.  Use while cooking to remind yourself to check on food or as a reminder to take your medicine, or as a  reminder to make a  call, or as a reminder to perform another task, etc.

## 2017-06-27 NOTE — Progress Notes (Signed)
Pre visit review using our clinic review tool, if applicable. No additional management support is needed unless otherwise documented below in the visit note. 

## 2017-06-30 ENCOUNTER — Encounter: Payer: Self-pay | Admitting: Internal Medicine

## 2017-06-30 DIAGNOSIS — N898 Other specified noninflammatory disorders of vagina: Secondary | ICD-10-CM | POA: Insufficient documentation

## 2017-06-30 DIAGNOSIS — N924 Excessive bleeding in the premenopausal period: Secondary | ICD-10-CM | POA: Insufficient documentation

## 2017-06-30 DIAGNOSIS — R102 Pelvic and perineal pain: Secondary | ICD-10-CM | POA: Insufficient documentation

## 2017-06-30 DIAGNOSIS — D259 Leiomyoma of uterus, unspecified: Secondary | ICD-10-CM | POA: Insufficient documentation

## 2017-06-30 DIAGNOSIS — N926 Irregular menstruation, unspecified: Secondary | ICD-10-CM | POA: Insufficient documentation

## 2017-06-30 DIAGNOSIS — R399 Unspecified symptoms and signs involving the genitourinary system: Secondary | ICD-10-CM | POA: Insufficient documentation

## 2017-06-30 DIAGNOSIS — N83209 Unspecified ovarian cyst, unspecified side: Secondary | ICD-10-CM | POA: Insufficient documentation

## 2017-06-30 NOTE — Progress Notes (Addendum)
Chief Complaint  Patient presents with  . Acute Visit    Memory issues.   Acute visit  1. Memory problems and trouble with recall she recently took a trip to [place] x 4 days but could not recall where she went which is not normal for her. Also she could not remember her 56 y.o nephews name recently and she has trouble at work doing tasks she used to have no problem, also trouble finding words or finishing her sentences. She is also having trouble remembering moving titles for movies she has seen 3x or more. Eventually things she is thinking of will come to her.  She has anxiety about this. Mom had dementia at 61/62 due to mini strokes and aunt m aunt dementia in 33s. She is getting anxiety about memory issues  2. HTN controlled today on hctz 12.5 mg qd 3. Left lower back lesion she still has not f/u surgery Dr. Hassell Done did not take insurance so referred to Dr. Delfino Lovett Copper   Review of Systems  Constitutional: Negative for weight loss.  HENT: Negative for hearing loss.   Cardiovascular: Negative for chest pain.  Gastrointestinal: Negative for abdominal pain.  Musculoskeletal: Negative for neck pain.  Skin: Negative for rash.       +growth left lower back   Neurological: Negative for headaches.  Psychiatric/Behavioral: Positive for memory loss. The patient is nervous/anxious.    Past Medical History:  Diagnosis Date  . Allergy   . Bacterial vaginosis   . Cervicalgia   . Chicken pox   . Colon polyps   . Diverticulitis   . Fibroid   . Hyperlipidemia    03/17/17 TC 253, HDL 115 per review of notes  . Hypertension   . Irregular menses   . Kidney stones   . Mid back pain   . Ovarian cyst   . UTI (urinary tract infection)    Past Surgical History:  Procedure Laterality Date  . 2008 fibroid surgery      laparoscopy   . APPENDECTOMY    . BREAST SURGERY     right breast benign bx  . COLONOSCOPY W/ POLYPECTOMY     2014   . DILATION AND CURETTAGE OF UTERUS     x 3 2/2  miscarriages   . TONSILLECTOMY    . WISDOM TOOTH EXTRACTION     Family History  Problem Relation Age of Onset  . Hypertension Mother   . Alcohol abuse Father   . Hypertension Father   . Colon cancer Father        26  . Stroke Maternal Grandmother   . Hypertension Maternal Grandmother   . Hypertension Maternal Grandfather   . Hypertension Paternal Grandmother   . Stroke Paternal Grandfather   . Hypertension Paternal Grandfather   . Colon cancer Other        60-70   Social History   Socioeconomic History  . Marital status: Divorced    Spouse name: Not on file  . Number of children: 0  . Years of education: 55  . Highest education level: Not on file  Occupational History  . Occupation: [employer]  Social Needs  . Financial resource strain: Not on file  . Food insecurity:    Worry: Not on file    Inability: Not on file  . Transportation needs:    Medical: Not on file    Non-medical: Not on file  Tobacco Use  . Smoking status: Never Smoker  . Smokeless tobacco:  Never Used  Substance and Sexual Activity  . Alcohol use: Yes    Alcohol/week: 0.0 oz    Comment: twice weekly  . Drug use: No  . Sexual activity: Yes    Birth control/protection: None  Lifestyle  . Physical activity:    Days per week: Not on file    Minutes per session: Not on file  . Stress: Not on file  Relationships  . Social connections:    Talks on phone: Not on file    Gets together: Not on file    Attends religious service: Not on file    Active member of club or organization: Not on file    Attends meetings of clubs or organizations: Not on file    Relationship status: Not on file  . Intimate partner violence:    Fear of current or ex partner: Not on file    Emotionally abused: Not on file    Physically abused: Not on file    Forced sexual activity: Not on file  Other Topics Concern  . Not on file  Social History Narrative   Fun: Relax, swim, exercise    Denies religious beliefs  effecting health care.   Denies abuse and feels safe at home.    Works in Nurse, learning disability and travels a lot    Never smoker/chew   Wine 1-2 glasses qhs    No illegal drugs    Current Meds  Medication Sig  . Cholecalciferol (VITAMIN D3 PO) Take by mouth.  . hydrochlorothiazide (MICROZIDE) 12.5 MG capsule TAKE 1 CAPSULE (12.5 MG TOTAL) BY MOUTH DAILY.  . naproxen (NAPROSYN) 500 MG tablet Take 1 tablet (500 mg total) by mouth 2 (two) times daily with a meal.   Allergies  Allergen Reactions  . Penicillins     Fever/hives, happened as a child    No results found for this or any previous visit (from the past 2160 hour(s)). Objective  Body mass index is 23.52 kg/m. Wt Readings from Last 3 Encounters:  06/27/17 132 lb 12.8 oz (60.2 kg)  04/13/17 129 lb 6 oz (58.7 kg)  03/23/17 132 lb 4 oz (60 kg)   Temp Readings from Last 3 Encounters:  06/27/17 98.3 F (36.8 C) (Oral)  04/13/17 98.2 F (36.8 C) (Oral)  03/23/17 97.7 F (36.5 C) (Oral)   BP Readings from Last 3 Encounters:  06/27/17 138/86  04/13/17 118/88  03/23/17 (!) 130/100   Pulse Readings from Last 3 Encounters:  06/27/17 66  04/13/17 64  03/23/17 66    Physical Exam  Constitutional: She is oriented to person, place, and time. Vital signs are normal. She appears well-developed and well-nourished.  HENT:  Head: Normocephalic and atraumatic.  Mouth/Throat: Oropharynx is clear and moist and mucous membranes are normal.  Eyes: Pupils are equal, round, and reactive to light. Conjunctivae are normal.  Cardiovascular: Normal rate, regular rhythm and normal heart sounds.  Pulmonary/Chest: Effort normal and breath sounds normal.  Neurological: She is alert and oriented to person, place, and time. Gait normal.  MMSE score 29/30 today   Skin: Skin is warm, dry and intact.  Psychiatric: She has a normal mood and affect. Her speech is normal and behavior is normal. Judgment and thought content normal. Cognition and memory  are normal.  Nursing note and vitals reviewed.   Assessment   1. Memory problems PHQ 9 score 29/30 today  2. HTN controlled  3. Left lower back lesion ? Lipoma vs other  4. HM  Plan  1.  Will refer for MRI w/o  Have OB/GYN check b12. See labs below has had thyroid labs, cbc Consider referral [facility] genetic testing for dementia Disc prevagen supplement I dont rec. For memory problems could try gingko biloba but this may/may not help  Pt denies she is depressed consider PHQ9 score at f/u   2.cont hctz 12.5 mg qd   3.pt to consider Dr. Delfino Lovett copper/Dr. Bary Castilla in the future consider bx 4.  Had flu  rec Tdap and hep B vaccine  Consider check hep C status in future   Of note on note 03/23/17: Reviewed labs from 03/09/17 Ob/GYN [facility] in [place] vit D 26 low, +BV, TSH 2.53 nl and free T4 checked  03/23/17 normal; blood cts normal, liver kidneys, normal, TC 231, HDL 115, TGs 57, LDL 102, pap 05/17/16 neg and neg HPV Consider check A1C in future h/o hyperglycemia  Still pending mammogram report had with OB/GYN requested records again  -no report but letter from 05/19/16 normal [facility] Colonoscopy 01/05/16 Dr. Hilarie Fredrickson diverticulosis/IH and FH colon cancer Will ask if she has had bone density with OB/GYN in the past at f/u   Vitamin d 26 03/13/17  TSH 2.53  CBC normal 03/13/17  CMET normal 03/13/18 Cr 0.55 GFR 106  TC 231, HDL 115, TG 57, LDL 102  Pap 05/17/16 neg +BV, neg GC/C, neg HPV [place] [facility] Provider: Dr. Olivia Mackie McLean-Scocuz[facility]

## 2017-07-04 ENCOUNTER — Ambulatory Visit: Payer: No Typology Code available for payment source | Admitting: Surgery

## 2017-07-04 ENCOUNTER — Encounter: Payer: Self-pay | Admitting: Surgery

## 2017-07-04 DIAGNOSIS — D171 Benign lipomatous neoplasm of skin and subcutaneous tissue of trunk: Secondary | ICD-10-CM

## 2017-07-04 DIAGNOSIS — R222 Localized swelling, mass and lump, trunk: Secondary | ICD-10-CM | POA: Diagnosis not present

## 2017-07-04 NOTE — Patient Instructions (Addendum)
Please give [place] a call in case you decide on removing your sebaceous cyst.     Epidermal Cyst An epidermal cyst is a small, painless lump under your skin. It may be called an epidermal inclusion cyst or an infundibular cyst. The cyst contains a grayish-white, bad-smelling substance (keratin). It is important not to pop epidermal cysts yourself. These cysts are usually harmless (benign), but they can get infected. Symptoms of infection may include:  Redness.  Inflammation.  Tenderness.  Warmth.  Fever.  A grayish-white, bad-smelling substance draining from the cyst.  Pus draining from the cyst.  Follow these instructions at home:  Take over-the-counter and prescription medicines only as told by your doctor.  If you were prescribed an antibiotic, use it as told by your doctor. Do not stop using the antibiotic even if you start to feel better.  Keep the area around your cyst clean and dry.  Wear loose, dry clothing.  Do not try to pop your cyst.  Avoid touching your cyst.  Check your cyst every day for signs of infection.  Keep all follow-up visits as told by your doctor. This is important. How is this prevented?  Wear clean, dry, clothing.  Avoid wearing tight clothing.  Keep your skin clean and dry. Shower or take baths every day.  Wash your body with a benzoyl peroxide wash when you shower or bathe. Contact a health care provider if:  Your cyst has symptoms of infection.  Your condition is not improving or is getting worse.  You have a cyst that looks different from other cysts you have had.  You have a fever. Get help right away if:  Redness spreads from the cyst into the surrounding area. This information is not intended to replace advice given to you by your health care provider. Make sure you discuss any questions you have with your health care provider. Document Released: 04/15/2004 Document Revised: 11/05/2015 Document Reviewed: 01/08/2015 Elsevier  Interactive Patient Education  [employer].

## 2017-07-04 NOTE — Progress Notes (Signed)
Kathy Riley is an 57 y.o. female.   Chief Complaint: Mass on back HPI: This patient with a mass on her back.  Her consult was requested by Dr. Aundra Dubin.  Present for 2 years but she thinks is been growing over the last 8 months and in fact she is concerned because her father had prostate cancer metastatic to the bony spine.  She does have a family member with ovarian cancer but no breast cancer.  This causes her minimal pain.  She works in [employer] does not smoke drinks alcohol almost daily.  Past Medical History:  Diagnosis Date  . Allergy   . Bacterial vaginosis   . Cervicalgia   . Chicken pox   . Colon polyps   . Diverticulitis   . Fibroid   . Hyperlipidemia    03/17/17 TC 253, HDL 115 per review of notes  . Hypertension   . Irregular menses   . Kidney stones   . Mid back pain   . Ovarian cyst   . UTI (urinary tract infection)     Past Surgical History:  Procedure Laterality Date  . 2008 fibroid surgery      laparoscopy   . APPENDECTOMY    . BREAST SURGERY     right breast benign bx  . COLONOSCOPY W/ POLYPECTOMY     2014   . DILATION AND CURETTAGE OF UTERUS     x 3 2/2 miscarriages   . TONSILLECTOMY    . WISDOM TOOTH EXTRACTION      Family History  Problem Relation Age of Onset  . Hypertension Mother   . Dementia Mother        61/62  . Alcohol abuse Father   . Hypertension Father   . Colon cancer Father        68  . Stroke Maternal Grandmother   . Hypertension Maternal Grandmother   . Hypertension Maternal Grandfather   . Hypertension Paternal Grandmother   . Stroke Paternal Grandfather   . Hypertension Paternal Grandfather   . Colon cancer Other        60-70  . Dementia Maternal Aunt    Social History:  reports that she has never smoked. She has never used smokeless tobacco. She reports that she drinks alcohol. She reports that she does not use drugs.  Allergies:  Allergies  Allergen Reactions  . Penicillins     Fever/hives, happened  as a child      (Not in a hospital admission)   Review of Systems:   Review of Systems  Constitutional: Negative.   HENT: Negative.   Eyes: Negative.   Respiratory: Negative.   Cardiovascular: Negative.   Gastrointestinal: Negative.   Genitourinary: Negative.   Musculoskeletal: Negative.   Skin: Negative.   Neurological: Negative.   Endo/Heme/Allergies: Negative.   Psychiatric/Behavioral: Negative.     Physical Exam:  Physical Exam  Constitutional: She is oriented to person, place, and time. She appears well-developed and well-nourished. No distress.  HENT:  Head: Normocephalic and atraumatic.  Eyes: Pupils are equal, round, and reactive to light. Right eye exhibits no discharge. Left eye exhibits no discharge. No scleral icterus.  Neck: Normal range of motion.  Cardiovascular: Normal rate and regular rhythm.  Pulmonary/Chest: Effort normal and breath sounds normal.  Abdominal: Soft. She exhibits no distension.  Musculoskeletal: Normal range of motion. She exhibits no edema.  Lymphadenopathy:    She has no cervical adenopathy.  Neurological: She is alert and oriented to person, place,  and time.  Skin: Skin is warm. No rash noted. She is not diaphoretic. No erythema.  Vitals reviewed. 4 or 5 cm lipoma in the left mid back near the midline.  Nontender but this  There were no vitals taken for this visit.    No results found for this or any previous visit (from the past 48 hour(s)). No results found.   Assessment/Plan Mass on back, to the left of midline.  Measures approximately 4 or 5 cm.  Gust options with the patient. I discussed with her the likelihood of this being a malignancy is very very low.  She has no known primary and is worried about her father's prostate cancer.  I discussed with her the procedure of removal and the risk of bleeding infection recurrence and cosmetic problems including scar and she wishes to think about this at this time.  She will  follow-up on an as-needed basis Florene Glen, MD, FACS

## 2017-07-15 ENCOUNTER — Other Ambulatory Visit: Payer: Self-pay | Admitting: Internal Medicine

## 2017-07-15 ENCOUNTER — Encounter: Payer: Self-pay | Admitting: Internal Medicine

## 2017-07-15 DIAGNOSIS — R413 Other amnesia: Secondary | ICD-10-CM

## 2017-07-18 ENCOUNTER — Telehealth: Payer: Self-pay | Admitting: Internal Medicine

## 2017-07-18 ENCOUNTER — Other Ambulatory Visit: Payer: Self-pay | Admitting: Internal Medicine

## 2017-07-18 DIAGNOSIS — E538 Deficiency of other specified B group vitamins: Secondary | ICD-10-CM

## 2017-07-18 NOTE — Telephone Encounter (Signed)
Copied from [place] [id number]. Topic: Quick Communication - See Telephone Encounter >> Jul 18, 2017 10:32 AM Synthia Innocent wrote: CRM for notification. See Telephone encounter for: 07/18/17. Patient would like to know if she has been tested for vitamin B1?

## 2017-07-18 NOTE — Telephone Encounter (Signed)
Order in  Please sch lab appt   tMS

## 2017-07-18 NOTE — Telephone Encounter (Signed)
Please advise 

## 2017-07-21 ENCOUNTER — Ambulatory Visit: Payer: No Typology Code available for payment source | Admitting: Internal Medicine

## 2017-07-21 ENCOUNTER — Encounter: Payer: Self-pay | Admitting: Internal Medicine

## 2017-07-21 VITALS — BP 132/84 | HR 65 | Temp 98.3°F | Ht 63.0 in | Wt 132.6 lb

## 2017-07-21 DIAGNOSIS — J4520 Mild intermittent asthma, uncomplicated: Secondary | ICD-10-CM

## 2017-07-21 DIAGNOSIS — J309 Allergic rhinitis, unspecified: Secondary | ICD-10-CM

## 2017-07-21 DIAGNOSIS — J322 Chronic ethmoidal sinusitis: Secondary | ICD-10-CM

## 2017-07-21 MED ORDER — AZITHROMYCIN 250 MG PO TABS: ORAL_TABLET | ORAL | 0 refills | Status: DC

## 2017-07-21 MED ORDER — MONTELUKAST SODIUM 10 MG PO TABS: 10.0000 mg | ORAL_TABLET | Freq: Every day | ORAL | 0 refills | Status: DC

## 2017-07-21 MED ORDER — ALBUTEROL SULFATE HFA 108 (90 BASE) MCG/ACT IN AERS: 2.0000 | INHALATION_SPRAY | Freq: Four times a day (QID) | RESPIRATORY_TRACT | 2 refills | Status: DC | PRN

## 2017-07-21 NOTE — Patient Instructions (Addendum)
Please try Allegra 12 hour 2x per day  Mucinex DM green label or Robitussin DM for cough  Cough drops  Warm tea with honey and lemon  Try singulair during fall and spring with antihistamine like allegra Try nasal saline 1-2 sprays then Flonase max 2 sprays 1x per day  Call me if not better  Get lab result of B12  Please take multivitamin for women with B12, B1, B6  Allergies, Adult An allergy is when your body's defense system (immune system) overreacts to an otherwise harmless substance (allergen) that you breathe in or eat or something that touches your skin. When you come into contact with something that you are allergic to, your immune system produces certain proteins (antibodies). These proteins cause cells to release chemicals (histamines) that trigger the symptoms of an allergic reaction. Allergies often affect the nasal passages (allergic rhinitis), eyes (allergic conjunctivitis), skin (atopic dermatitis), and stomach. Allergies can be mild or severe. Allergies cannot spread from person to person (are not contagious). They can develop at any age and may be outgrown. What increases the risk? You may be at greater risk of allergies if other people in your family have allergies. What are the signs or symptoms? Symptoms depend on what type of allergy you have. They may include:  Runny, stuffy nose.  Sneezing.  Itchy mouth, ears, or throat.  Postnasal drip.  Sore throat.  Itchy, red, watery, or puffy eyes.  Skin rash or hives.  Stomach pain.  Vomiting.  Diarrhea.  Bloating.  Wheezing or coughing.  People with a severe allergy to food, medicine, or an insect bite may have a life-threatening allergic reaction (anaphylaxis). Symptoms of anaphylaxis include:  Hives.  Itching.  Flushed face.  Swollen lips, tongue, or mouth.  Tight or swollen throat.  Chest pain or tightness in the chest.  Trouble breathing or shortness of breath.  Rapid heartbeat.  Dizziness  or fainting.  Vomiting.  Diarrhea.  Pain in the abdomen.  How is this diagnosed? This condition is diagnosed based on:  Your symptoms.  Your family and medical history.  A physical exam.  You may need to see a health care provider who specializes in treating allergies (allergist). You may also have tests, including:  Skin tests to see which allergens are causing your symptoms, such as: ? Skin prick test. In this test, your skin is pricked with a tiny needle and exposed to small amounts of possible allergens to see if your skin reacts. ? Intradermal skin test. In this test, a small amount of allergen is injected under your skin to see if your skin reacts. ? Patch test. In this test, a small amount of allergen is placed on your skin and then your skin is covered with a bandage. Your health care provider will check your skin after a couple of days to see if a rash has developed.  Blood tests.  Challenges tests. In this test, you inhale a small amount of allergen by mouth to see if you have an allergic reaction.  You may also be asked to:  Keep a food diary. A food diary is a record of all the foods and drinks you have in a day and any symptoms you experience.  Practice an elimination diet. An elimination diet involves eliminating specific foods from your diet and then adding them back in one by one to find out if a certain food causes an allergic reaction.  How is this treated? Treatment for allergies depends on your  symptoms. Treatment may include:  Cold compresses to soothe itching and swelling.  Eye drops.  Nasal sprays.  Using a saline spray or container (neti pot) to flush out the nose (nasal irrigation). These methods can help clear away mucus and keep the nasal passages moist.  Using a humidifier.  Oral antihistamines or other medicines to block allergic reaction and inflammation.  Skin creams to treat rashes or itching.  Diet changes to eliminate food allergy  triggers.  Repeated exposure to tiny amounts of allergens to build up a tolerance and prevent future allergic reactions (immunotherapy). These include: ? Allergy shots. ? Oral treatment. This involves taking small doses of an allergen under the tongue (sublingual immunotherapy).  Emergency epinephrine injection (auto-injector) in case of an allergic emergency. This is a self-injectable, pre-measured medicine that must be given within the first few minutes of a serious allergic reaction.  Follow these instructions at home:  Avoid known allergens whenever possible.  If you suffer from airborne allergens, wash out your nose daily. You can do this with a saline spray or a neti pot to flush out your nose (nasal irrigation).  Take over-the-counter and prescription medicines only as told by your health care provider.  Keep all follow-up visits as told by your health care provider. This is important.  If you are at risk of a severe allergic reaction (anaphylaxis), keep your auto-injector with you at all times.  If you have ever had anaphylaxis, wear a medical alert bracelet or necklace that states you have a severe allergy. Contact a health care provider if:  Your symptoms do not improve with treatment. Get help right away if:  You have symptoms of anaphylaxis, such as: ? Swollen mouth, tongue, or throat. ? Pain or tightness in your chest. ? Trouble breathing or shortness of breath. ? Dizziness or fainting. ? Severe abdominal pain, vomiting, or diarrhea. This information is not intended to replace advice given to you by your health care provider. Make sure you discuss any questions you have with your health care provider. Document Released: 06/01/2002 Document Revised: 07/07/2016 Document Reviewed: 09/24/2015 Elsevier Interactive Patient Education  [employer].

## 2017-07-21 NOTE — Progress Notes (Signed)
Chief Complaint  Patient presents with  . Follow-up   F/u  1. H/o dust, mite, mold allergy worse in spring/fall at one time on allergy shots, She has been hoarse. Sx's started last week thurs/friday after she mowed acres of yard with PND, hoarse, sore throat, wheezing, sob, chest tightness. She has been on zyrtec x 3 days which has helped. She also c/o chest tightness, nasal congestion    Review of Systems  Constitutional: Negative for fever.  HENT: Positive for congestion and sore throat. Negative for hearing loss.   Eyes: Negative for blurred vision.  Respiratory: Positive for shortness of breath and wheezing.   Cardiovascular: Negative for chest pain.       +chest tightness  +chest congestion   Skin: Negative for rash.   Past Medical History:  Diagnosis Date  . Allergy   . Bacterial vaginosis   . Cervicalgia   . Chicken pox   . Colon polyps   . Diverticulitis   . Fibroid   . Hyperlipidemia    03/17/17 TC 253, HDL 115 per review of notes  . Hypertension   . Irregular menses   . Kidney stones   . Mid back pain   . Ovarian cyst   . UTI (urinary tract infection)    Past Surgical History:  Procedure Laterality Date  . 2008 fibroid surgery      laparoscopy   . APPENDECTOMY    . BREAST SURGERY     right breast benign bx  . COLONOSCOPY W/ POLYPECTOMY     2014   . DILATION AND CURETTAGE OF UTERUS     x 3 2/2 miscarriages   . TONSILLECTOMY    . WISDOM TOOTH EXTRACTION     Family History  Problem Relation Age of Onset  . Hypertension Mother   . Dementia Mother        61/62  . Alcohol abuse Father   . Hypertension Father   . Colon cancer Father        2  . Stroke Maternal Grandmother   . Hypertension Maternal Grandmother   . Hypertension Maternal Grandfather   . Hypertension Paternal Grandmother   . Stroke Paternal Grandfather   . Hypertension Paternal Grandfather   . Colon cancer Other        60-70  . Dementia Maternal Aunt    Social History    Socioeconomic History  . Marital status: Divorced    Spouse name: Not on file  . Number of children: 0  . Years of education: 30  . Highest education level: Not on file  Occupational History  . Occupation: [employer]  Social Needs  . Financial resource strain: Not on file  . Food insecurity:    Worry: Not on file    Inability: Not on file  . Transportation needs:    Medical: Not on file    Non-medical: Not on file  Tobacco Use  . Smoking status: Never Smoker  . Smokeless tobacco: Never Used  Substance and Sexual Activity  . Alcohol use: Yes    Alcohol/week: 0.0 oz    Comment: twice weekly  . Drug use: No  . Sexual activity: Yes    Birth control/protection: None  Lifestyle  . Physical activity:    Days per week: Not on file    Minutes per session: Not on file  . Stress: Not on file  Relationships  . Social connections:    Talks on phone: Not on file  Gets together: Not on file    Attends religious service: Not on file    Active member of club or organization: Not on file    Attends meetings of clubs or organizations: Not on file    Relationship status: Not on file  . Intimate partner violence:    Fear of current or ex partner: Not on file    Emotionally abused: Not on file    Physically abused: Not on file    Forced sexual activity: Not on file  Other Topics Concern  . Not on file  Social History Narrative   Fun: Relax, swim, exercise    Denies religious beliefs effecting health care.   Denies abuse and feels safe at home.    Works in Nurse, learning disability and travels a lot    Never smoker/chew   Wine 1-2 glasses qhs    No illegal drugs    Current Meds  Medication Sig  . cetirizine (ZYRTEC) 10 MG tablet Take 10 mg by mouth daily.  . Cholecalciferol (VITAMIN D3 PO) Take 10,000 Units by mouth daily.   . hydrochlorothiazide (MICROZIDE) 12.5 MG capsule TAKE 1 CAPSULE (12.5 MG TOTAL) BY MOUTH DAILY.  . naproxen (NAPROSYN) 500 MG tablet Take 1 tablet (500 mg  total) by mouth 2 (two) times daily with a meal.  . YUVAFEM 10 MCG TABS vaginal tablet    Allergies  Allergen Reactions  . Penicillins     Fever/hives, happened as a child    No results found for this or any previous visit (from the past 2160 hour(s)). Objective  Body mass index is 23.49 kg/m. Wt Readings from Last 3 Encounters:  07/21/17 132 lb 9.6 oz (60.1 kg)  06/27/17 132 lb 12.8 oz (60.2 kg)  04/13/17 129 lb 6 oz (58.7 kg)   Temp Readings from Last 3 Encounters:  07/21/17 98.3 F (36.8 C) (Oral)  06/27/17 98.3 F (36.8 C) (Oral)  04/13/17 98.2 F (36.8 C) (Oral)   BP Readings from Last 3 Encounters:  07/21/17 132/84  06/27/17 138/86  04/13/17 118/88   Pulse Readings from Last 3 Encounters:  07/21/17 65  06/27/17 66  04/13/17 64    Physical Exam  Constitutional: She is oriented to person, place, and time. Vital signs are normal. She appears well-developed and well-nourished.  HENT:  Head: Normocephalic and atraumatic.  Mouth/Throat: Oropharynx is clear and moist and mucous membranes are normal.  Mild ethmoid ttp   Eyes: Pupils are equal, round, and reactive to light. Conjunctivae are normal.  Cardiovascular: Normal rate, regular rhythm and normal heart sounds.  Pulmonary/Chest: Effort normal and breath sounds normal. She has no wheezes.  Neurological: She is alert and oriented to person, place, and time. Gait normal.  Skin: Skin is warm, dry and intact.  Psychiatric: She has a normal mood and affect. Her speech is normal and behavior is normal. Judgment and thought content normal. Cognition and memory are normal.  Nursing note and vitals reviewed.   Assessment   1. Allergies with reactive airway dz no h/o asthma  2. Sinusitis  Plan   1. Allegra 12 hour, add singulair, NS, flonase, prn Venotlin  2. zpack  F/u if not better  Trial of robitussin dm or mucinex dm green label for cough   Of note need to check B12     Provider: Dr. Olivia Mackie  [facility]

## 2017-07-21 NOTE — Progress Notes (Signed)
Pre visit review using our clinic review tool, if applicable. No additional management support is needed unless otherwise documented below in the visit note. 

## 2017-09-19 ENCOUNTER — Ambulatory Visit: Payer: No Typology Code available for payment source | Admitting: Internal Medicine

## 2017-11-25 ENCOUNTER — Ambulatory Visit: Payer: Self-pay

## 2017-11-25 NOTE — Telephone Encounter (Signed)
Patient called in and says "I am in [place], [place] and was having chest/back pain. I went to an ED here, which ended up being a children[facility] hospital, [facility]. They took me on back and drew blood work. They told me I needed to stay for about 3 more hours to get another enzyme result, but I decided to leave. They told me to follow up with my provider. I am still having the chest and back pain. I am taking Nexium for the chest pain. I will not be back in town until 12/05/17 and would like to schedule the appointment." I advised the first available is Tuesday, 12/06/17 at 1000 with Dr. Terese Door, patient agreed to the appointment. She says "they told me to come back to the same ED if I started experiencing pain again like I was having. I need for them to get my records to your office. How do I do that? Can you request them?" I advised she will need to go back to the hospital and request her medical records sent to [facility], fax number [phone] given to patient and advised patient to seek emergency help if needed.   Reason for Disposition . Requesting regular office appointment  Protocols used: INFORMATION ONLY CALL-A-AH

## 2017-11-29 ENCOUNTER — Telehealth: Payer: Self-pay

## 2017-11-29 ENCOUNTER — Telehealth: Payer: Self-pay | Admitting: Internal Medicine

## 2017-11-29 NOTE — Telephone Encounter (Signed)
Reviewed labs all seem normal and CXR and heart enzymes If still having sx's she needs to move appt up sooner than 9/17 to further w/u  Appears she left before they could finish with work up per the note  [place]

## 2017-11-29 NOTE — Telephone Encounter (Signed)
Copied from [place]. Topic: Inquiry >> Nov 29, 2017 11:56 AM Ahmed Prima L wrote: Reason for CRM: Patient would like to know if the office ever received her medical records from the hospital in [place] from when she was in the hospital. She is coming in for a hospital f/u on 9/17. The paperwork that she did receive from the hospital f/u when she was discharged said she has PLEURISY and wants to know does she need to do anything for that before her appointment?

## 2017-11-29 NOTE — Telephone Encounter (Signed)
Did not receive if she could try to obtain before visit 9/17 We will work up based on their work up if they missed further work but I need records   [employer]

## 2017-11-30 NOTE — Telephone Encounter (Signed)
Please contact patient

## 2017-11-30 NOTE — Telephone Encounter (Signed)
Patient stated she would touch base with them.

## 2017-11-30 NOTE — Telephone Encounter (Signed)
Patient has been informed. Tightness in chest still hurts and back hurts, SOB.

## 2017-12-06 ENCOUNTER — Encounter: Payer: Self-pay | Admitting: Internal Medicine

## 2017-12-06 ENCOUNTER — Ambulatory Visit: Payer: No Typology Code available for payment source | Admitting: Internal Medicine

## 2017-12-06 ENCOUNTER — Telehealth: Payer: Self-pay | Admitting: Internal Medicine

## 2017-12-06 VITALS — BP 126/90 | HR 67 | Temp 98.2°F | Ht 63.0 in | Wt 130.6 lb

## 2017-12-06 DIAGNOSIS — E785 Hyperlipidemia, unspecified: Secondary | ICD-10-CM

## 2017-12-06 DIAGNOSIS — J309 Allergic rhinitis, unspecified: Secondary | ICD-10-CM | POA: Diagnosis not present

## 2017-12-06 DIAGNOSIS — K219 Gastro-esophageal reflux disease without esophagitis: Secondary | ICD-10-CM

## 2017-12-06 DIAGNOSIS — R091 Pleurisy: Secondary | ICD-10-CM | POA: Diagnosis not present

## 2017-12-06 DIAGNOSIS — M542 Cervicalgia: Secondary | ICD-10-CM

## 2017-12-06 DIAGNOSIS — I10 Essential (primary) hypertension: Secondary | ICD-10-CM

## 2017-12-06 DIAGNOSIS — M546 Pain in thoracic spine: Secondary | ICD-10-CM

## 2017-12-06 DIAGNOSIS — R0789 Other chest pain: Secondary | ICD-10-CM

## 2017-12-06 DIAGNOSIS — G8929 Other chronic pain: Secondary | ICD-10-CM

## 2017-12-06 DIAGNOSIS — R5382 Chronic fatigue, unspecified: Secondary | ICD-10-CM

## 2017-12-06 MED ORDER — MONTELUKAST SODIUM 10 MG PO TABS: 10.0000 mg | ORAL_TABLET | Freq: Every day | ORAL | 1 refills | Status: DC

## 2017-12-06 MED ORDER — PANTOPRAZOLE SODIUM 40 MG PO TBEC: 40.0000 mg | DELAYED_RELEASE_TABLET | Freq: Every day | ORAL | 0 refills | Status: DC

## 2017-12-06 NOTE — Progress Notes (Addendum)
Chief Complaint  Patient presents with  . Follow-up   ED f/u from late 10/2017-11/2017 in [place] for "pleurisy" and multiple complaints  1. She still c/o chest and mid back pain 5/10 today in between her shoulder blades and sob and hurts to take a deep breath worse with lying flat feels like spine is caving into her chest x 4+ weeks since 11/15/17 when seen in ED at [facility]. CP was associated with nausea only. reports difficulty swallowing and sore throat and pressure feeling in neck pressing on throat this is again worse with lying flat and sitting she reports pressure sensation under her diaphragm.  She was seen 2x in the ED in [place] records reviewed as below and scanned to chart. She takes Naproxen w/o relief and she tried otc nexium which did not help with sx's. She initially thought sx's may have been MSK due to working out but they are not resolving. She denies being anxious. She reports she is due to travel again for work w/in the next 2 weeks and wants a note for work to be excused.   11/15/17 CMET,CBC normal 11/15/17 CXR normal  11/15/17 EKG SB, troponin negative, lipase negative  Of note after 11/15/17 ED visit she left AMA before work up complete but went back again on  11/26/2017 labs below:  Troponin negative x 1  CMET, CBC normal  Given ativan 4 mg x 1 due to appearing anxious-->of note asked pt about anxiety today denies anxiety today  After this visit CP thought to be noncardiac etiology  CXR 11/26/17 normal   2. HTN controlled on HCTZ 12.5 mg qd   3. Allergies she stopped singulair b/c it was not needed at the time be wants to see if helps as allergy sx's flare in fall typically   4. She c/o fatigue and feeling like she was about to pass out recently as well as well as numbness tingling in b/l forearms and right lip drooping previously since last visit  MRI brain 07/14/17 normal   5. 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 Mild spinal stenosis.  Xray C spine 03/23/17 Mild C4-5 and C6-7 disc space narrowing and foraminal narrowing.  Moderate C5-6 disc space narrowing and foraminal narrowing (greater on left).  Xray T spine 03/23/17 Mild dextroscoliosis lower thoracic spine.  Mild T6-7 and T7-8 disc space narrowing.  Review of Systems  Constitutional: Positive for malaise/fatigue. Negative for fever and weight loss.  HENT: Negative for hearing loss.   Eyes: Negative for blurred vision.  Respiratory: Positive for shortness of breath.   Cardiovascular: Positive for chest pain.  Musculoskeletal: Positive for back pain, joint pain and neck pain.  Skin: Negative for rash.  Neurological: Positive for tingling and sensory change.  Psychiatric/Behavioral: The patient is not nervous/anxious.    Past Medical History:  Diagnosis Date  . Allergy   . Bacterial vaginosis   . Cervicalgia   . Chicken pox   . Colon polyps   . Diverticulitis   . Fibroid   . Hyperlipidemia    03/17/17 TC 253, HDL 115 per review of notes  . Hypertension   . Irregular menses   . Kidney stones   . Mid back pain   . Ovarian cyst   . UTI (urinary tract infection)  Past Surgical History:  Procedure Laterality Date  . 2008 fibroid surgery      laparoscopy   . APPENDECTOMY    . BREAST SURGERY     right breast benign bx  . COLONOSCOPY W/ POLYPECTOMY     2014   . DILATION AND CURETTAGE OF UTERUS     x 3 2/2 miscarriages   . TONSILLECTOMY    . WISDOM TOOTH EXTRACTION     Family History  Problem Relation Age of Onset  . Hypertension Mother   . Dementia Mother        61/62  . Alcohol abuse Father   . Hypertension Father   . Colon cancer Father        105  . Stroke Maternal Grandmother   . Hypertension Maternal Grandmother   . Hypertension  Maternal Grandfather   . Hypertension Paternal Grandmother   . Stroke Paternal Grandfather   . Hypertension Paternal Grandfather   . Colon cancer Other        60-70  . Dementia Maternal Aunt    Social History   Socioeconomic History  . Marital status: Divorced    Spouse name: Not on file  . Number of children: 0  . Years of education: 49  . Highest education level: Not on file  Occupational History  . Occupation: [employer]  Social Needs  . Financial resource strain: Not on file  . Food insecurity:    Worry: Not on file    Inability: Not on file  . Transportation needs:    Medical: Not on file    Non-medical: Not on file  Tobacco Use  . Smoking status: Never Smoker  . Smokeless tobacco: Never Used  Substance and Sexual Activity  . Alcohol use: Yes    Alcohol/week: 0.0 standard drinks    Comment: twice weekly  . Drug use: No  . Sexual activity: Yes    Birth control/protection: None  Lifestyle  . Physical activity:    Days per week: Not on file    Minutes per session: Not on file  . Stress: Not on file  Relationships  . Social connections:    Talks on phone: Not on file    Gets together: Not on file    Attends religious service: Not on file    Active member of club or organization: Not on file    Attends meetings of clubs or organizations: Not on file    Relationship status: Not on file  . Intimate partner violence:    Fear of current or ex partner: Not on file    Emotionally abused: Not on file    Physically abused: Not on file    Forced sexual activity: Not on file  Other Topics Concern  . Not on file  Social History Narrative   Fun: Relax, swim, exercise    Denies religious beliefs effecting health care.   Denies abuse and feels safe at home.    Works in Nurse, learning disability and travels a lot    Never smoker/chew   Wine 1-2 glasses qhs    No illegal drugs    Current Meds  Medication Sig  . cetirizine (ZYRTEC) 10 MG tablet Take 10 mg by mouth daily.   . Cholecalciferol (VITAMIN D3 PO) Take 10,000 Units by mouth daily.   . hydrochlorothiazide (MICROZIDE) 12.5 MG capsule TAKE 1 CAPSULE (12.5 MG TOTAL) BY MOUTH DAILY.  . naproxen (NAPROSYN) 500 MG tablet Take 1 tablet (500 mg total) by mouth 2 (  two) times daily with a meal.  . YUVAFEM 10 MCG TABS vaginal tablet    Allergies  Allergen Reactions  . Ibuprofen Other (See Comments)  . Penicillins     Fever/hives, happened as a child    No results found for this or any previous visit (from the past 2160 hour(s)). Objective  Body mass index is 23.13 kg/m. Wt Readings from Last 3 Encounters:  12/06/17 130 lb 9.6 oz (59.2 kg)  07/21/17 132 lb 9.6 oz (60.1 kg)  06/27/17 132 lb 12.8 oz (60.2 kg)   Temp Readings from Last 3 Encounters:  12/06/17 98.2 F (36.8 C) (Oral)  07/21/17 98.3 F (36.8 C) (Oral)  06/27/17 98.3 F (36.8 C) (Oral)   BP Readings from Last 3 Encounters:  12/06/17 126/90  07/21/17 132/84  06/27/17 138/86   Pulse Readings from Last 3 Encounters:  12/06/17 67  07/21/17 65  06/27/17 66    Physical Exam  Constitutional: She is oriented to person, place, and time. Vital signs are normal. She appears well-developed and well-nourished. She is cooperative.  HENT:  Head: Normocephalic and atraumatic.  Mouth/Throat: Oropharynx is clear and moist and mucous membranes are normal.  Eyes: Pupils are equal, round, and reactive to light. Conjunctivae are normal.  Cardiovascular: Normal rate, regular rhythm and normal heart sounds.  Pulmonary/Chest: Effort normal and breath sounds normal. She exhibits tenderness.  Chest wall ttp on exam mild to mod   Neurological: She is alert and oriented to person, place, and time. Gait normal.  Skin: Skin is warm, dry and intact.  Tanned skin   Psychiatric: She has a normal mood and affect. Her speech is normal and behavior is normal. Judgment and thought content normal. Cognition and memory are normal.  Nursing note and vitals  reviewed.   Assessment   1. Pleuritic and reproducible chest pain etiology unclear ddx r/o PE h/o airplane travel, costochondritis, GERD, anxiety/stress, pericarditis. Pt denies anxiety today though I deem this could possibly be contributing. Less likely infection RA w/u in the past. No h/o tick bite so low susp. For lyme/RMSF related myocarditis as cause. CXRs negative x 2 8/27 and 11/26/17 .  2. HTN controlled/h/o HLD  3. Allergies 4. Fatigue, chronic unclear etiology could be related to anxiety, menopause vs other  5. Chronic neck and mid back pain with abnormal MRI and Xrays see HPI  6. HM Plan   1. Prn naproxen  Add protonix 40 mg qd  CTA chest today r/o PE Troponin negative x 2 ED visits [place] 8/27 and 11/26/17  Consider echo, ESR, CRP, CBCs normal prior [place] labs r/o pericarditis if sx's continue EKG with SB noted 11/15/17 If anxiety factor consider adding cymbalta will disc with pt further in future  Note to be off work x 2 weeks until w/u complete  2. Cont hctz 12.5 mg qd  Lipid due 02/2018  3. Add singulair to zyrtec esp for fall when allergies worse  4.  Pt had TSH, T4 nl 03/09/17  She does have vitamin D def and rec prev take 5000 IU D3 otc qd  B12 lab ordered per pt request but has not been checked yet offered today will do at f/u  5. Refer to [place] [facility] for recs and also PT orders prev referred to another facility Dr. Smitty Cords per pt request but she did not keep appt  Pt wants to be seen w/in the next 2 weeks advised pt this may not be possible 6.  Flu shot  due Tdap rec will give info at f/u  Not immune hep B Per pt been checked hep C with OB/GYN need to get record  Declines HIV testing   Never smoker  Pap had 05/19/16 neg except BV neg HPV f/u [facility] in [place] 05/19/18  H/o hyperplastic polyps colonoscopy had 01/05/16  05/17/16 negative Mammogram had with [place]  -advised pt to sch mammogram overdue and see below -CTA chest 12/06/17  +left breast mass ? Cyst/cyst collection vs fibroadenoma needs b/l diagnostic mammogram with [place]  Reviewed vitamin B12 06/30/17 1770 high   Provider: Dr. Olivia Mackie [facility]

## 2017-12-06 NOTE — Telephone Encounter (Signed)
Lungs clear no blood clot Left breast mass 1.5 x 2.0 cm probably cyst or cyst cluster or fibroadenoma rec diagnostic mammogram and ultrasound -does she want me to order this?  -have her ask insurance where they will cover this for her?  [place]

## 2017-12-06 NOTE — Patient Instructions (Addendum)
Please call OB/GYN and have them order mammogram  F/u 1 week     Food Choices for Gastroesophageal Reflux Disease, Adult When you have gastroesophageal reflux disease (GERD), the foods you eat and your eating habits are very important. Choosing the right foods can help ease your discomfort. What guidelines do I need to follow?  Choose fruits, vegetables, whole grains, and low-fat dairy products.  Choose low-fat meat, fish, and poultry.  Limit fats such as oils, salad dressings, butter, nuts, and avocado.  Keep a food diary. This helps you identify foods that cause symptoms.  Avoid foods that cause symptoms. These may be different for everyone.  Eat small meals often instead of 3 large meals a day.  Eat your meals slowly, in a place where you are relaxed.  Limit fried foods.  Cook foods using methods other than frying.  Avoid drinking alcohol.  Avoid drinking large amounts of liquids with your meals.  Avoid bending over or lying down until 2-3 hours after eating. What foods are not recommended? These are some foods and drinks that may make your symptoms worse: Vegetables Tomatoes. Tomato juice. Tomato and spaghetti sauce. Chili peppers. Onion and garlic. Horseradish. Fruits Oranges, grapefruit, and lemon (fruit and juice). Meats High-fat meats, fish, and poultry. This includes hot dogs, ribs, ham, sausage, salami, and bacon. Dairy Whole milk and chocolate milk. Sour cream. Cream. Butter. Ice cream. Cream cheese. Drinks Coffee and tea. Bubbly (carbonated) drinks or energy drinks. Condiments Hot sauce. Barbecue sauce. Sweets/Desserts Chocolate and cocoa. Donuts. Peppermint and spearmint. Fats and Oils High-fat foods. This includes [place] fries and potato chips. Other Vinegar. Strong spices. This includes black pepper, white pepper, red pepper, cayenne, curry powder, cloves, ginger, and chili powder. The items listed above may not be a complete list of foods and drinks  to avoid. Contact your dietitian for more information. This information is not intended to replace advice given to you by your health care provider. Make sure you discuss any questions you have with your health care provider. Document Released: 09/07/2011 Document Revised: 08/14/2015 Document Reviewed: 01/10/2013 Elsevier Interactive Patient Education  2017 [place].  Gastroesophageal Reflux Disease, Adult Normally, food travels down the esophagus and stays in the stomach to be digested. If a person has gastroesophageal reflux disease (GERD), food and stomach acid move back up into the esophagus. When this happens, the esophagus becomes sore and swollen (inflamed). Over time, GERD can make small holes (ulcers) in the lining of the esophagus. Follow these instructions at home: Diet  Follow a diet as told by your doctor. You may need to avoid foods and drinks such as: ? Coffee and tea (with or without caffeine). ? Drinks that contain alcohol. ? Energy drinks and sports drinks. ? Carbonated drinks or sodas. ? Chocolate and cocoa. ? Peppermint and mint flavorings. ? Garlic and onions. ? Horseradish. ? Spicy and acidic foods, such as peppers, chili powder, curry powder, vinegar, hot sauces, and BBQ sauce. ? Citrus fruit juices and citrus fruits, such as oranges, lemons, and limes. ? Tomato-based foods, such as red sauce, chili, salsa, and pizza with red sauce. ? Fried and fatty foods, such as donuts, french fries, potato chips, and high-fat dressings. ? High-fat meats, such as hot dogs, rib eye steak, sausage, ham, and bacon. ? High-fat dairy items, such as whole milk, butter, and cream cheese.  Eat small meals often. Avoid eating large meals.  Avoid drinking large amounts of liquid with your meals.  Avoid eating meals  during the 2-3 hours before bedtime.  Avoid lying down right after you eat.  Do not exercise right after you eat. General instructions  Pay attention to any  changes in your symptoms.  Take over-the-counter and prescription medicines only as told by your doctor. Do not take aspirin, ibuprofen, or other NSAIDs unless your doctor says it is okay.  Do not use any tobacco products, including cigarettes, chewing tobacco, and e-cigarettes. If you need help quitting, ask your doctor.  Wear loose clothes. Do not wear anything tight around your waist.  Raise (elevate) the head of your bed about 6 inches (15 cm).  Try to lower your stress. If you need help doing this, ask your doctor.  If you are overweight, lose an amount of weight that is healthy for you. Ask your doctor about a safe weight loss goal.  Keep all follow-up visits as told by your doctor. This is important. Contact a doctor if:  You have new symptoms.  You lose weight and you do not know why it is happening.  You have trouble swallowing, or it hurts to swallow.  You have wheezing or a cough that keeps happening.  Your symptoms do not get better with treatment.  You have a hoarse voice. Get help right away if:  You have pain in your arms, neck, jaw, teeth, or back.  You feel sweaty, dizzy, or light-headed.  You have chest pain or shortness of breath.  You throw up (vomit) and your throw up looks like blood or coffee grounds.  You pass out (faint).  Your poop (stool) is bloody or black.  You cannot swallow, drink, or eat. This information is not intended to replace advice given to you by your health care provider. Make sure you discuss any questions you have with your health care provider. Document Released: 08/25/2007 Document Revised: 08/14/2015 Document Reviewed: 07/03/2014 Elsevier Interactive Patient Education  2018 [place], also called pleuritis, is irritation and swelling (inflammation) of the linings of the lungs. The linings of the lungs are called pleura. They cover the outside of the lungs and the inside of the chest wall. There is  a small amount of fluid (pleural fluid) between the pleura that allows the lungs to move in and out smoothly when you breathe. Pleurisy causes the pleura to be rough and dry and to rub together when you breathe, which is painful. In some cases, pleurisy can cause pleural fluid to build up between the pleura (pleural effusion). What are the causes? Common causes of this condition include:  A lung infection caused by bacteria or a virus.  A blood clot that travels to the lung (pulmonary embolism).  Air leaking into the pleural space (pneumothorax).  Lung cancer or a lung tumor.  A chest injury.  Diseases that can cause lung inflammation. These include rheumatoid arthritis, lupus, sickle cell disease, inflammatory bowel disease, and pancreatitis.  Heart or chest surgery.  Lung damage from inhaling asbestos.  A lung reaction to certain medicines.  Sometimes the cause is unknown. What are the signs or symptoms? Chest pain is the main symptom of this condition. The pain is usually on one side. Chest pain may start suddenly and be sharp or stabbing. It may become a constant dull ache. You may also feel pain in your back or shoulder. The pain may get worse when you cough, take deep breaths, or make sudden movements. Other symptoms may include:  Shortness of breath.  Noisy breathing (  wheezing).  Cough.  Chills.  Fever.  How is this diagnosed? This condition may be diagnosed based on:  Your medical history.  Your symptoms.  A physical exam. Your health care provider will listen to your breathing with a stethoscope to check for a rough, rubbing sound (friction rub). If you have pleural effusion, your breathing sounds may be muffled.  Tests, such as: ? Blood tests to check for infections or diseases and to measure the oxygen in your blood. ? Imaging studies of your lungs. These may include a chest X-ray, ultrasound, MRI, or CT scan. ? A procedure to remove pleural fluid with a  needle for testing (thoracentesis).  How is this treated? Treatment for this condition depends on the cause. Pleurisy that was caused by a virus usually clears up within 2 weeks. Treatment for pleurisy may include:  NSAIDs to help relieve pain and swelling.  Antibiotic medicines, if your condition was caused by a bacterial infection.  Prescription pain or cough medicine.  Medicines to dissolve a blood clot, if your condition was caused by pulmonary embolism.  Removal of pleural fluid or air.  Follow these instructions at home: Medicines  Take over-the-counter and prescription medicines only as told by your health care provider.  If you were prescribed an antibiotic, take it as told by your health care provider. Do not stop taking the antibiotic even if you start to feel better. Activity  Rest and return to your normal activities as told by your health care provider. Ask your health care provider what activities are safe for you.  Do not drive or use heavy machinery while taking prescription pain medicine. General instructions  Monitor your pleurisy for any changes.  Take deep breaths often, even if it is painful. This can help prevent lung infection (pneumonia) and collapse of lung tissue (atelectasis).  When lying down, lie on your painful side. This may reduce pain.  Do not smoke. If you need help quitting, ask your health care provider.  Keep all follow-up visits as told by your health care provider. This is important. Contact a health care provider if:  You have pain that: ? Gets worse. ? Does not get better with medicine. ? Lasts for more than 1 week.  You have a fever or chills.  Your cough or shortness of breath is not improving at home.  You cough up pus-like (purulent) secretions. Get help right away if:  Your lips, fingernails, or toenails darken or turn blue.  You cough up blood.  You have any of the following symptoms that get worse: ? Difficulty  breathing. ? Shortness of breath. ? Wheezing.  You have pain that spreads into your neck, arms, or jaw.  You develop a rash.  You vomit.  You faint. Summary  Pleurisy is inflammation of the linings of the lungs (pleura).  Pleurisy causes pain that makes it difficult for you to breathe or cough.  Pleurisy is often caused by an underlying infection or disease.  Treatment of pleurisy depends on the cause, and it often includes medicines. This information is not intended to replace advice given to you by your health care provider. Make sure you discuss any questions you have with your health care provider. Document Released: 03/08/2005 Document Revised: 12/01/2015 Document Reviewed: 12/01/2015 Elsevier Interactive Patient Education  2017 Elsevier Inc.  Chest Wall Pain Chest wall pain is pain in or around the bones and muscles of your chest. Sometimes, an injury causes this pain. Sometimes, the cause may  not be known. This pain may take several weeks or longer to get better. Follow these instructions at home: Pay attention to any changes in your symptoms. Take these actions to help with your pain:  Rest as told by your health care provider.  Avoid activities that cause pain. These include any activities that use your chest muscles or your abdominal and side muscles to lift heavy items.  If directed, apply ice to the painful area: ? Put ice in a plastic bag. ? Place a towel between your skin and the bag. ? Leave the ice on for 20 minutes, 2-3 times per day.  Take over-the-counter and prescription medicines only as told by your health care provider.  Do not use tobacco products, including cigarettes, chewing tobacco, and e-cigarettes. If you need help quitting, ask your health care provider.  Keep all follow-up visits as told by your health care provider. This is important.  Contact a health care provider if:  You have a fever.  Your chest pain becomes worse.  You have new  symptoms. Get help right away if:  You have nausea or vomiting.  You feel sweaty or light-headed.  You have a cough with phlegm (sputum) or you cough up blood.  You develop shortness of breath. This information is not intended to replace advice given to you by your health care provider. Make sure you discuss any questions you have with your health care provider. Document Released: 03/08/2005 Document Revised: 07/17/2015 Document Reviewed: 06/03/2014 Elsevier Interactive Patient Education  [employer].

## 2017-12-06 NOTE — Progress Notes (Signed)
Pre visit review using our clinic review tool, if applicable. No additional management support is needed unless otherwise documented below in the visit note. 

## 2017-12-07 ENCOUNTER — Encounter: Payer: Self-pay | Admitting: Internal Medicine

## 2017-12-07 DIAGNOSIS — G8929 Other chronic pain: Secondary | ICD-10-CM | POA: Insufficient documentation

## 2017-12-07 DIAGNOSIS — M546 Pain in thoracic spine: Secondary | ICD-10-CM

## 2017-12-07 NOTE — Telephone Encounter (Signed)
Left message for patient to return call back. PEC may give results and obtain information.  

## 2017-12-08 NOTE — Telephone Encounter (Signed)
Please advise 

## 2017-12-08 NOTE — Telephone Encounter (Signed)
Pt called back and informed patient of message. Pt would like PCP to order this. She goes to [facility].  Pt stated since lungs were clear, would the next step be an echocardiogram and/or GI testing.

## 2017-12-09 ENCOUNTER — Other Ambulatory Visit: Payer: Self-pay | Admitting: Internal Medicine

## 2017-12-09 DIAGNOSIS — K635 Polyp of colon: Secondary | ICD-10-CM | POA: Insufficient documentation

## 2017-12-09 DIAGNOSIS — N6320 Unspecified lump in the left breast, unspecified quadrant: Secondary | ICD-10-CM | POA: Insufficient documentation

## 2017-12-09 DIAGNOSIS — N202 Calculus of kidney with calculus of ureter: Secondary | ICD-10-CM | POA: Insufficient documentation

## 2017-12-09 DIAGNOSIS — K5790 Diverticulosis of intestine, part unspecified, without perforation or abscess without bleeding: Secondary | ICD-10-CM | POA: Insufficient documentation

## 2017-12-09 DIAGNOSIS — R0781 Pleurodynia: Secondary | ICD-10-CM

## 2017-12-09 HISTORY — DX: Calculus of kidney with calculus of ureter: N20.2

## 2017-12-09 HISTORY — DX: Unspecified lump in the left breast, unspecified quadrant: N63.20

## 2017-12-09 HISTORY — DX: Polyp of colon: K63.5

## 2017-12-09 NOTE — Telephone Encounter (Signed)
Please sch echo  Please sch mammogram [place] sch appt asap please  Call pt and let know of appts   Thanks Menan

## 2017-12-13 ENCOUNTER — Telehealth: Payer: Self-pay

## 2017-12-13 NOTE — Telephone Encounter (Signed)
Copied from [place] [id number]. Topic: Quick Communication - Office Called Patient >> Dec 13, 2017 11:32 AM Micheline Maze B wrote: Reason for CRM: ok for PEC to speak to pt:lvm for her to rtc. Dr. Olivia Mackie McLean-Scocuzza has ordered a diagnostic mammogram for her at [facility] in [place]. They are requesting her to come by and sign a medical records release so they can get her images from [place] from her last mammogram. Once they receive the images they can schedule her mammogram. [facility] is located at [place]. >> Dec 13, 2017  1:22 PM Waylan Rocher, Lumin L wrote: Patient says she's already set up a mammogram for herself somewhere else.

## 2018-04-27 ENCOUNTER — Other Ambulatory Visit: Payer: Self-pay | Admitting: Internal Medicine

## 2018-04-27 DIAGNOSIS — I10 Essential (primary) hypertension: Secondary | ICD-10-CM

## 2018-04-27 MED ORDER — HYDROCHLOROTHIAZIDE 12.5 MG PO CAPS: ORAL_CAPSULE | ORAL | 2 refills | Status: DC

## 2018-07-31 ENCOUNTER — Telehealth: Payer: Self-pay

## 2018-07-31 NOTE — Telephone Encounter (Signed)
Pt called requesting a appt with provider, appt scheduled with mclean-scocuzza for tomorrow at 11 am, pt did not specify why she needed an appt. Nina,cma

## 2018-07-31 NOTE — Telephone Encounter (Signed)
Copied from [place] [id number]. Topic: Appointment Scheduling - Scheduling Inquiry for Clinic >> Jul 31, 2018 10:22 AM Virl Axe D wrote: Reason for CRM: Pt called to schedule virtual with Dr. Olivia Mackie. No answer on FC line. Please return call. CB#[phone]

## 2018-08-01 ENCOUNTER — Telehealth: Payer: Self-pay | Admitting: Internal Medicine

## 2018-08-01 ENCOUNTER — Ambulatory Visit (INDEPENDENT_AMBULATORY_CARE_PROVIDER_SITE_OTHER): Payer: BLUE CROSS/BLUE SHIELD | Admitting: Internal Medicine

## 2018-08-01 ENCOUNTER — Other Ambulatory Visit: Payer: Self-pay

## 2018-08-01 DIAGNOSIS — J309 Allergic rhinitis, unspecified: Secondary | ICD-10-CM | POA: Diagnosis not present

## 2018-08-01 DIAGNOSIS — H04203 Unspecified epiphora, bilateral lacrimal glands: Secondary | ICD-10-CM

## 2018-08-01 DIAGNOSIS — Z1389 Encounter for screening for other disorder: Secondary | ICD-10-CM

## 2018-08-01 DIAGNOSIS — Z1329 Encounter for screening for other suspected endocrine disorder: Secondary | ICD-10-CM | POA: Diagnosis not present

## 2018-08-01 DIAGNOSIS — M549 Dorsalgia, unspecified: Secondary | ICD-10-CM

## 2018-08-01 DIAGNOSIS — I10 Essential (primary) hypertension: Secondary | ICD-10-CM | POA: Diagnosis not present

## 2018-08-01 DIAGNOSIS — T161XXA Foreign body in right ear, initial encounter: Secondary | ICD-10-CM

## 2018-08-01 DIAGNOSIS — G8929 Other chronic pain: Secondary | ICD-10-CM

## 2018-08-01 DIAGNOSIS — E559 Vitamin D deficiency, unspecified: Secondary | ICD-10-CM

## 2018-08-01 DIAGNOSIS — H6991 Unspecified Eustachian tube disorder, right ear: Secondary | ICD-10-CM

## 2018-08-01 MED ORDER — OLOPATADINE HCL 0.2 % OP SOLN: 1.0000 [drp] | Freq: Every day | OPHTHALMIC | 2 refills | Status: DC

## 2018-08-01 MED ORDER — MONTELUKAST SODIUM 10 MG PO TABS: 10.0000 mg | ORAL_TABLET | Freq: Every day | ORAL | 3 refills | Status: DC

## 2018-08-01 NOTE — Progress Notes (Signed)
Pre visit review using our clinic review tool, if applicable. No additional management support is needed unless otherwise documented below in the visit note. 

## 2018-08-01 NOTE — Progress Notes (Addendum)
Virtual Visit via Video Note  I connected with Kathy Riley  on 08/01/18 at 11:05 AM EDT by a video enabled telemedicine application and verified that I am speaking with the correct person using two identifiers.  Location patient: home Location provider:work  Persons participating in the virtual visit: patient, provider  I discussed the limitations of evaluation and management by telemedicine and the availability of in person appointments. The patient expressed understanding and agreed to proceed.   HPI: 1. HTN not checking but taking hctz 12.5 mg qd. She is checking her temp and running 97.0 to 97.5 F  2. Right ear 2 weeks ago wearing a mask and cutting grass and thought a bug flew in right ear and ear felt clogged the next day tried Q tips and hydrogen peroxide thinking could be wax but nothing came out I.e ear wax, ear is popping, Qtip met with resistance in right ear and if presses on right earlobe hears ringing/buzzing in right ear and sensitive to sound and feels buzzing in right ear and popping 3. Chronic back pain never saw NS and back pain improved for now  4. Allergies not taking sinulair doing zyrtec 10 mg bid and not helping will refill singulair and disc rec use flonase c/o eyes being watery and dry and swelling under eyes and sensation of needle prick in right >left eye. Using real tears and Nafcon A otc eye drops w/o relief  ROS: See pertinent positives and negatives per HPI.  Past Medical History:  Diagnosis Date  . Allergy   . Bacterial vaginosis   . Cervicalgia   . Chicken pox   . Colon polyps   . Diverticulitis   . Fibroid   . Hyperlipidemia    03/17/17 TC 253, HDL 115 per review of notes  . Hypertension   . Irregular menses   . Kidney stones   . Mid back pain   . Ovarian cyst   . UTI (urinary tract infection)     Past Surgical History:  Procedure Laterality Date  . 2008 fibroid surgery      laparoscopy   . APPENDECTOMY    . BREAST SURGERY     right  breast benign bx  . COLONOSCOPY W/ POLYPECTOMY     2014   . DILATION AND CURETTAGE OF UTERUS     x 3 2/2 miscarriages   . TONSILLECTOMY    . WISDOM TOOTH EXTRACTION      Family History  Problem Relation Age of Onset  . Hypertension Mother   . Dementia Mother        61/62  . Alcohol abuse Father   . Hypertension Father   . Colon cancer Father        32  . Stroke Maternal Grandmother   . Hypertension Maternal Grandmother   . Hypertension Maternal Grandfather   . Hypertension Paternal Grandmother   . Stroke Paternal Grandfather   . Hypertension Paternal Grandfather   . Colon cancer Other        60-70  . 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  cetirizine (ZYRTEC) 10 MG tablet, Take 10 mg by mouth daily., Disp: , Rfl:  .  Cholecalciferol (VITAMIN D3 PO), Take 10,000 Units by mouth daily. , Disp: , Rfl:  .  hydrochlorothiazide (MICROZIDE) 12.5 MG capsule, TAKE 1 CAPSULE (12.5 MG TOTAL) BY MOUTH DAILY., Disp: 90 capsule, Rfl: 2 .  montelukast (SINGULAIR) 10 MG tablet, Take 1 tablet (10 mg total) by mouth at bedtime., Disp: 90 tablet, Rfl: 3 .  naproxen (NAPROSYN) 500 MG tablet, Take 1 tablet (500 mg total) by mouth 2 (two) times daily with a meal., Disp: 180 tablet, Rfl: 1 .  pantoprazole (PROTONIX) 40 MG tablet, Take 1 tablet (40 mg total) by mouth daily. 30 minutes before food, Disp: 90 tablet, Rfl: 0 .  YUVAFEM 10 MCG TABS vaginal tablet, , Disp: , Rfl: 1 .  Olopatadine HCl 0.2 % SOLN, Apply 1 drop to eye daily., Disp: 1 Bottle, Rfl: 2  EXAM:  VITALS per patient if applicable:  GENERAL: alert, oriented, appears well and in no acute distress  HEENT: atraumatic, conjunttiva clear, no obvious abnormalities on inspection of external nose and ears  NECK: normal movements of the head and neck  LUNGS: on inspection no signs of respiratory distress, breathing rate appears normal, no obvious  gross SOB, gasping or wheezing  CV: no obvious cyanosis  MS: moves all visible extremities without noticeable abnormality  PSYCH/NEURO: pleasant and cooperative, no obvious depression or anxiety, speech and thought processing grossly intact  ASSESSMENT AND PLAN:  Discussed the following assessment and plan:  Watery eyes - Plan: Olopatadine HCl 0.2 % SOLN  Allergic rhinitis, unspecified seasonality, unspecified trigger - Plan: montelukast (SINGULAIR) 10 MG tablet, Olopatadine HCl 0.2 % SOLN rec Flonase otc as well   Essential hypertension - Plan: Comprehensive metabolic panel, CBC w/Diff, Lipid panel, Urinalysis, Routine w reflex microscopic hctz 12.5 mg qd  -sch fasting labs 11/27/2018   Vitamin D deficiency - Plan: Vitamin D (25 hydroxy)  Ear foreign body, right, initial encounter ? Bug vs other I.e wax - Plan: Ambulatory referral to ENT Disorder of right eustachian tube - Plan: Ambulatory referral to ENT Dr. Tami Ribas or [place] to look in right ear and remove object if present  -rec otc zyrtec, flonase with singulair   HM Flu shot due Tdap rec will give info at f/u  Not immune hep B Per pt been checked hep C with OB/GYN need to get record  Declines HIV testing   Never smoker  Pap had 05/19/16 neg except BV neg HPV f/u OB/GYN [facility] in [place] 05/19/18  H/o hyperplastic polyps colonoscopy had 01/05/16  05/17/16 negative Mammogram had with [place]  -advised pt to sch mammogram overdue and see below -CTA chest 12/06/17 +left breast mass ? Cyst/cyst collection vs fibroadenoma needs b/l diagnostic mammogram with [place] -per pt not sure if had updated mammogram will call [facility] to see and pt prev declined b/l dx mammo with [place] due to cost and cysts have been in breasts per pt   Reviewed vitamin B12 06/30/17 1770 high   Of note pt has not seen neurosurgery about chronic back pain   [facility] seen 08/30/2018 given yuvafem 10 mcg tablet 2x per week and pap done  this day not resulted yet   I discussed the assessment and treatment plan with the patient. The patient was provided an opportunity to ask questions and all were answered. The patient agreed with the plan and demonstrated an understanding of the instructions.   The patient was advised to call back or seek an in-person evaluation if the symptoms worsen or if the condition fails to improve as anticipated.  Time spent 25 minutes  Delorise Jackson, MD

## 2018-08-01 NOTE — Telephone Encounter (Signed)
Call [facility] need to get copy of most recent mammogram and did they check patient for hepatitis C?   [place]

## 2018-08-02 NOTE — Telephone Encounter (Signed)
Nurse stated she has sent fax over.

## 2018-08-03 DIAGNOSIS — H6123 Impacted cerumen, bilateral: Secondary | ICD-10-CM | POA: Diagnosis not present

## 2018-08-03 DIAGNOSIS — H6061 Unspecified chronic otitis externa, right ear: Secondary | ICD-10-CM | POA: Diagnosis not present

## 2018-08-03 DIAGNOSIS — H93299 Other abnormal auditory perceptions, unspecified ear: Secondary | ICD-10-CM | POA: Diagnosis not present

## 2018-08-10 ENCOUNTER — Telehealth: Payer: Self-pay | Admitting: Internal Medicine

## 2018-08-10 NOTE — Telephone Encounter (Signed)
Sent to [facility]

## 2018-08-10 NOTE — Telephone Encounter (Signed)
This could be ringing in her ear due to stress or eustachian tube disorder due to allergies  -I rec. Flonase and over the counter allergy pill as well and her to try Singulair   racing heart could be due to stress, increased caffeine intake, anxiety -if it continues a heart monitor could see what is going on with her heart with the heart doctors   What is her blood pressure?

## 2018-08-10 NOTE — Telephone Encounter (Signed)
Copied from [place] [id number]. Topic: General - Inquiry >> Aug 10, 2018 12:15 PM Mathis Bud wrote: Reason for CRM: Patient called stating she went to [facility] nose [facility] throat 2 weeks ago. Patient states PCP referred, Patient states she is still having issues with right ear and would like PCP or nurse to give her a call back.  Patient heart palpitations she believes due to stress. It has been going on for two-six weeks.  Patient would like to know next steps.  Patient call back # [phone]

## 2018-08-11 ENCOUNTER — Other Ambulatory Visit: Payer: Self-pay | Admitting: Internal Medicine

## 2018-08-11 DIAGNOSIS — R002 Palpitations: Secondary | ICD-10-CM

## 2018-08-11 NOTE — Telephone Encounter (Signed)
Pt states she is fine with one in [place] .  Creedence Heiss,cma

## 2018-08-11 NOTE — Telephone Encounter (Signed)
Kathy Riley is a specialized cardiologist not a general cardiology  She needs a referral to a general cardiologist which can be with [place] What does she think?   [place]

## 2018-08-11 NOTE — Telephone Encounter (Signed)
Called and spoke with Kathy Riley and gave her information about her ears and Kathy Riley states she needs a referral for a cariologist and she is requesting Dr. Virl Axe as her cardiologist.  Kathy Riley also states she does not know her BP has not taking it.  Olivia Royse,cma

## 2018-08-11 NOTE — Telephone Encounter (Signed)
Referred to [facility] palpitations for holter and further w/u   [place]

## 2018-08-16 ENCOUNTER — Telehealth: Payer: Self-pay

## 2018-08-16 NOTE — Telephone Encounter (Signed)
Virtual Visit Pre-Appointment Phone Call  "Kathy Riley, I am calling you today to discuss your upcoming appointment. We are currently trying to limit exposure to the virus that causes COVID-19 by seeing patients at home rather than in the office."  1. "What is the BEST phone number to call the day of the visit?" - include this in appointment notes  2. "Do you have or have access to (through a family member/friend) a smartphone with video capability that we can use for your visit?" a. If yes - list this number in appt notes as "cell" (if different from BEST phone #) and list the appointment type as a VIDEO visit in appointment notes b. If no - list the appointment type as a PHONE visit in appointment notes  3. Confirm consent - "In the setting of the current Covid19 crisis, you are scheduled for a video visit with your provider on August 22, 2018 at 3:00PM.  Just as we do with many in-office visits, in order for you to participate in this visit, we must obtain consent.  If you'd like, I can send this to your mychart (if signed up) or email for you to review.  Otherwise, I can obtain your verbal consent now.  All virtual visits are billed to your insurance company just like a normal visit would be.  By agreeing to a virtual visit, we'd like you to understand that the technology does not allow for your provider to perform an examination, and thus may limit your provider's ability to fully assess your condition. If your provider identifies any concerns that need to be evaluated in person, we will make arrangements to do so.  Finally, though the technology is pretty good, we cannot assure that it will always work on either your or our end, and in the setting of a video visit, we may have to convert it to a phone-only visit.  In either situation, we cannot ensure that we have a secure connection.  Are you willing to proceed?" STAFF: Did the patient verbally acknowledge consent to telehealth visit? Document  YES/NO here: YES  4. Advise patient to be prepared - "Two hours prior to your appointment, go ahead and check your blood pressure, pulse, oxygen saturation, and your weight (if you have the equipment to check those) and write them all down. When your visit starts, your provider will ask you for this information. If you have an Apple Watch or Kardia device, please plan to have heart rate information ready on the day of your appointment. Please have a pen and paper handy nearby the day of the visit as well."  5. Give patient instructions for MyChart download to smartphone OR Doximity/Doxy.me as below if video visit (depending on what platform provider is using)  6. Inform patient they will receive a phone call 15 minutes prior to their appointment time (may be from unknown caller ID) so they should be prepared to answer    [place] has been deemed a candidate for a follow-up tele-health visit to limit community exposure during the Covid-19 pandemic. I spoke with the patient via phone to ensure availability of phone/video source, confirm preferred email & phone number, and discuss instructions and expectations.  I reminded Kathy Riley to be prepared with any vital sign and/or heart rhythm information that could potentially be obtained via home monitoring, at the time of her visit. I reminded Kathy Riley to expect a phone call prior to her visit.  Rene Paci McClain 08/16/2018 3:34 PM   INSTRUCTIONS FOR DOWNLOADING THE MYCHART APP TO SMARTPHONE  - The patient must first make sure to have activated MyChart and know their login information - If Apple, go to [employer] and type in MyChart in the search bar and download the app. If Android, ask patient to go to [employer] and type in [place] in the search bar and download the app. The app is free but as with any other app downloads, their phone may require them to verify saved payment information or  Apple/Android password.  - The patient will need to then log into the app with their MyChart username and password, and select [facility] as their healthcare provider to link the account. When it is time for your visit, go to the MyChart app, find appointments, and click Begin Video Visit. Be sure to Select Allow for your device to access the Microphone and Camera for your visit. You will then be connected, and your provider will be with you shortly.  **If they have any issues connecting, or need assistance please contact MyChart service desk [phone])**  **If using a computer, in order to ensure the best quality for their visit they will need to use either of the following Internet Browsers: [employer], or Google Chrome**  IF USING DOXIMITY or DOXY.ME - The patient will receive a link just prior to their visit by text.     FULL LENGTH CONSENT FOR TELE-HEALTH VISIT   I hereby voluntarily request, consent and authorize [facility] and its employed or contracted physicians, physician assistants, nurse practitioners or other licensed health care professionals (the Practitioner), to provide me with telemedicine health care services (the "Services") as deemed necessary by the treating Practitioner. I acknowledge and consent to receive the Services by the Practitioner via telemedicine. I understand that the telemedicine visit will involve communicating with the Practitioner through live audiovisual communication technology and the disclosure of certain medical information by electronic transmission. I acknowledge that I have been given the opportunity to request an in-person assessment or other available alternative prior to the telemedicine visit and am voluntarily participating in the telemedicine visit.  I understand that I have the right to withhold or withdraw my consent to the use of telemedicine in the course of my care at any time, without affecting my right to future care  or treatment, and that the Practitioner or I may terminate the telemedicine visit at any time. I understand that I have the right to inspect all information obtained and/or recorded in the course of the telemedicine visit and may receive copies of available information for a reasonable fee.  I understand that some of the potential risks of receiving the Services via telemedicine include:  Marland Kitchen Delay or interruption in medical evaluation due to technological equipment failure or disruption; . Information transmitted may not be sufficient (e.g. poor resolution of images) to allow for appropriate medical decision making by the Practitioner; and/or  . In rare instances, security protocols could fail, causing a breach of personal health information.  Furthermore, I acknowledge that it is my responsibility to provide information about my medical history, conditions and care that is complete and accurate to the best of my ability. I acknowledge that Practitioner's advice, recommendations, and/or decision may be based on factors not within their control, such as incomplete or inaccurate data provided by me or distortions of diagnostic images or specimens that may result from electronic transmissions. I understand that  the practice of medicine is not an exact science and that Practitioner makes no warranties or guarantees regarding treatment outcomes. I acknowledge that I will receive a copy of this consent concurrently upon execution via email to the email address I last provided but may also request a printed copy by calling the office of [facility].    I understand that my insurance will be billed for this visit.   I have read or had this consent read to me. . I understand the contents of this consent, which adequately explains the benefits and risks of the Services being provided via telemedicine.  . I have been provided ample opportunity to ask questions regarding this consent and the Services and have had  my questions answered to my satisfaction. . I give my informed consent for the services to be provided through the use of telemedicine in my medical care  By participating in this telemedicine visit I agree to the above.

## 2018-08-17 DIAGNOSIS — N6322 Unspecified lump in the left breast, upper inner quadrant: Secondary | ICD-10-CM | POA: Diagnosis not present

## 2018-08-22 ENCOUNTER — Telehealth (INDEPENDENT_AMBULATORY_CARE_PROVIDER_SITE_OTHER): Payer: BC Managed Care – PPO | Admitting: Cardiovascular Disease

## 2018-08-22 ENCOUNTER — Other Ambulatory Visit: Payer: Self-pay

## 2018-08-22 ENCOUNTER — Encounter: Payer: Self-pay | Admitting: Cardiovascular Disease

## 2018-08-22 VITALS — BP 154/105 | HR 76 | Ht 63.0 in | Wt 130.0 lb

## 2018-08-22 DIAGNOSIS — R002 Palpitations: Secondary | ICD-10-CM | POA: Diagnosis not present

## 2018-08-22 DIAGNOSIS — R0609 Other forms of dyspnea: Secondary | ICD-10-CM

## 2018-08-22 NOTE — Patient Instructions (Signed)
Medication Instructions:  No change in medications If you need a refill on your cardiac medications before your next appointment, please call your pharmacy.   Lab work: None If you have labs (blood work) drawn today and your tests are completely normal, you will receive your results only by: Marland Kitchen MyChart Message (if you have MyChart) OR . A paper copy in the mail If you have any lab test that is abnormal or we need to change your treatment, we will call you to review the results.  Testing/Procedures: 14-day ZIO patch monitor for palpitations echocardiogram for shortness of breath  Follow-Up: Follow-up as needed based on cardiac testing.

## 2018-08-22 NOTE — Progress Notes (Signed)
Virtual Visit via Video Note   This visit type was conducted due to national recommendations for restrictions regarding the COVID-19 Pandemic (e.g. social distancing) in an effort to limit this patient's exposure and mitigate transmission in our community.  Due to her co-morbid illnesses, this patient is at least at moderate risk for complications without adequate follow up.  This format is felt to be most appropriate for this patient at this time.  All issues noted in this document were discussed and addressed.  A limited physical exam was performed with this format.  Please refer to the patient's chart for her consent to telehealth for [facility].   Date:  08/22/2018   ID:  Kathy Riley, DOB October 18, 1960, MRN [id number]  Patient Location: Home Provider Location: Office  PCP:  McLean-Scocuzza, Nino Glow, MD  Cardiologist:  No primary care provider on file.  Electrophysiologist:  None   Evaluation Performed:  Consultation - Kathy Riley was referred by Dr. Terese Door for the evaluation of palpitations.  Chief Complaint: Palpitations  History of Present Illness:    Kathy Riley is a 58 y.o. female was seen via video visit for evaluation of palpitations.  She has known history of essential hypertension, hyperlipidemia, chronic back pain and allergies.  No previous cardiac history. She used to exercise regularly at the gym doing at least 5 miles of either treadmill or stationary bike.  However, the gym closed in March.  Since then, she has noticed worsening exertional dyspnea and fatigue.  Over the last 2 years, she has experienced intermittent episodes of palpitations and tachycardia that can happen during the day or at night.  This is typically not associated with dizziness, syncope or presyncope.  She denies any chest pain.  She reports that her mother had irregular heartbeats but not sure if she had any official arrhythmia.  She is not a smoker.  The patient does not  have symptoms concerning for COVID-19 infection (fever, chills, cough, or new shortness of breath).    Past Medical History:  Diagnosis Date  . Allergy   . Bacterial vaginosis   . Cervicalgia   . Chicken pox   . Colon polyps   . Diverticulitis   . Fibroid   . Hyperlipidemia    03/17/17 TC 253, HDL 115 per review of notes  . Hypertension   . Irregular menses   . Kidney stones   . Mid back pain   . Ovarian cyst   . UTI (urinary tract infection)    Past Surgical History:  Procedure Laterality Date  . 2008 fibroid surgery      laparoscopy   . APPENDECTOMY    . BREAST SURGERY     right breast benign bx  . COLONOSCOPY W/ POLYPECTOMY     2014   . DILATION AND CURETTAGE OF UTERUS     x 3 2/2 miscarriages   . TONSILLECTOMY    . WISDOM TOOTH EXTRACTION       No outpatient medications have been marked as taking for the 08/22/18 encounter (Telemedicine) with Wellington Hampshire, MD.     Allergies:   Ibuprofen and Penicillins   Social History   Tobacco Use  . Smoking status: Never Smoker  . Smokeless tobacco: Never Used  Substance Use Topics  . Alcohol use: Yes    Alcohol/week: 0.0 standard drinks    Comment: twice weekly  . Drug use: No     Family Hx: The patient's family history includes Alcohol abuse in her father;  Colon cancer in her father and another family member; Dementia in her maternal aunt and mother; Hypertension in her father, maternal grandfather, maternal grandmother, mother, paternal grandfather, and paternal grandmother; Stroke in her maternal grandmother and paternal grandfather.  ROS:   Please see the history of present illness.     All other systems reviewed and are negative.   Prior CV studies:   The following studies were reviewed today:    Labs/Other Tests and Data Reviewed:    EKG:  An ECG dated 11/15/2017 was personally reviewed today and demonstrated:  Sinus bradycardia with no significant ST or T wave changes.  Recent Labs: No results  found for requested labs within last 8760 hours.   Recent Lipid Panel No results found for: CHOL, TRIG, HDL, CHOLHDL, LDLCALC, LDLDIRECT  Wt Readings from Last 3 Encounters:  08/22/18 130 lb (59 kg)  12/06/17 130 lb 9.6 oz (59.2 kg)  07/21/17 132 lb 9.6 oz (60.1 kg)     Objective:    Vital Signs:  BP (!) 154/105   Ht 5\' 3"  (1.6 m)   Wt 130 lb (59 kg)   BMI 23.03 kg/m    VITAL SIGNS:  reviewed GEN:  no acute distress EYES:  sclerae anicteric, EOMI - Extraocular Movements Intact RESPIRATORY:  normal respiratory effort, symmetric expansion SKIN:  no rash, lesions or ulcers. MUSCULOSKELETAL:  no obvious deformities. NEURO:  alert and oriented x 3, no obvious focal deficit PSYCH:  normal affect  ASSESSMENT & PLAN:    1. Palpitations: This has been intermittent over the last 3 years with no evaluation by monitor.  I recommend a 14-day ZIO patch monitor for evaluation.  Routine labs are ordered on the patient including TSH which should be helpful. 2. Exertional dyspnea without chest pain.  I will obtain an echocardiogram to evaluate ejection fraction, diastolic function and pulmonary pressure.  The patient has no chest discomfort and the suspicion for ischemic heart disease is low.  No need for stress testing at this time. 3. Essential hypertension: Blood pressure is elevated today.  She is only on small dose hydrochlorothiazide which can be increased or an ARB can be added.  COVID-19 Education: The signs and symptoms of COVID-19 were discussed with the patient and how to seek care for testing (follow up with PCP or arrange E-visit).  The importance of social distancing was discussed today.  Time:   Today, I have spent 15 minutes with the patient with telehealth technology discussing the above problems.     Medication Adjustments/Labs and Tests Ordered: Current medicines are reviewed at length with the patient today.  Concerns regarding medicines are outlined above.   Tests  Ordered: No orders of the defined types were placed in this encounter.   Medication Changes: No orders of the defined types were placed in this encounter.   Disposition:  Follow up prn  Signed, Kathlyn Sacramento, MD  08/22/2018 2:48 PM    [place]

## 2018-08-22 NOTE — Addendum Note (Signed)
Addended by: Lamar Laundry on: 08/22/2018 03:32 PM   Modules accepted: Orders

## 2018-08-30 DIAGNOSIS — R002 Palpitations: Secondary | ICD-10-CM | POA: Diagnosis not present

## 2018-08-30 DIAGNOSIS — N951 Menopausal and female climacteric states: Secondary | ICD-10-CM | POA: Diagnosis not present

## 2018-08-30 DIAGNOSIS — Z6821 Body mass index (BMI) 21.0-21.9, adult: Secondary | ICD-10-CM | POA: Diagnosis not present

## 2018-08-30 DIAGNOSIS — Z01419 Encounter for gynecological examination (general) (routine) without abnormal findings: Secondary | ICD-10-CM | POA: Diagnosis not present

## 2018-09-01 ENCOUNTER — Encounter: Payer: Self-pay | Admitting: Internal Medicine

## 2018-09-04 ENCOUNTER — Telehealth: Payer: Self-pay | Admitting: Cardiovascular Disease

## 2018-09-04 NOTE — Telephone Encounter (Signed)
Attempted to contact pt.  VM was full.  Unable to leave message. Per Dr. Tyrell Antonio note, pt needs to wear monitor for 14 days.

## 2018-09-04 NOTE — Telephone Encounter (Signed)
Please call to discuss ZIO, patient is not sure how long she should wear it.

## 2018-09-06 NOTE — Telephone Encounter (Signed)
Spoke with patient and reviewed placement of monitor along with how to activate it. Also reviewed prep and when she will remove it. She was very appreciative for the call back and the step by step instructions. She reports that she will place it on tonight and reviewed that she would then wear it until July 1st. She verbalized understanding of our conversation with no further questions at this time.

## 2018-09-07 ENCOUNTER — Ambulatory Visit (INDEPENDENT_AMBULATORY_CARE_PROVIDER_SITE_OTHER): Payer: BC Managed Care – PPO

## 2018-09-07 DIAGNOSIS — R002 Palpitations: Secondary | ICD-10-CM | POA: Diagnosis not present

## 2018-09-12 DIAGNOSIS — L820 Inflamed seborrheic keratosis: Secondary | ICD-10-CM | POA: Diagnosis not present

## 2018-09-12 DIAGNOSIS — L918 Other hypertrophic disorders of the skin: Secondary | ICD-10-CM | POA: Diagnosis not present

## 2018-10-18 ENCOUNTER — Telehealth: Payer: Self-pay | Admitting: Cardiovascular Disease

## 2018-10-18 NOTE — Telephone Encounter (Signed)
Left message for patient to call back and schedule for the Echo

## 2018-10-18 NOTE — Telephone Encounter (Signed)
Patient would monitor results. Patient also needs an echocardiogram, but would like to get monitor results before she schedules. Please call to discuss.

## 2018-10-18 NOTE — Telephone Encounter (Signed)
Pt made aware of monitor results with verbal understanding. Pt sts that she will need to contact her insurance provider to inquire on the out of pocket cost for the echo. Pt sts that her deductible is high and her decision to proceed will be based on her out of pocket cost. Pt will call back to give an update and if she decides to start BB for symptoms.

## 2018-10-18 NOTE — Telephone Encounter (Signed)
-----   Message from Lamar Laundry, RN sent at 10/17/2018  7:26 AM EDT ----- Regarding: pt needs an echo This pt was to have an echo scheduled after her 08/22/18 telehealth visit with Dr.Arida. Please call the pt to schedule.   Thanks, Lattie Haw

## 2018-10-18 NOTE — Telephone Encounter (Signed)
-----   Message from Wellington Hampshire, MD sent at 10/16/2018  4:05 PM EDT ----- Inform patient that monitor showed only 2 short episodes of SVT and rare PVCs.  Overall, nothing significant but treatment with a small dose beta-blocker can be considered for symptomatic relief if she is still bothered by this. She is supposed to get an echocardiogram done as well but I do not see that scheduled.  Forwarding to PCP as an [place].

## 2018-11-23 DIAGNOSIS — B078 Other viral warts: Secondary | ICD-10-CM | POA: Diagnosis not present

## 2018-11-23 DIAGNOSIS — L820 Inflamed seborrheic keratosis: Secondary | ICD-10-CM | POA: Diagnosis not present

## 2018-12-14 ENCOUNTER — Telehealth: Payer: Self-pay

## 2018-12-14 ENCOUNTER — Ambulatory Visit: Payer: Self-pay

## 2018-12-14 NOTE — Telephone Encounter (Signed)
Incoming call from Patient who complains of left arm and shoulder pain.  Ratted  High moderate.  . Reports that it is a different type of soreness.   Reports. That it is swollen  And tightness the hand. Office recommended Patient be evauated at Urgent care.         Reason for Disposition . Numbness (i.e., loss of sensation) in hand or fingers  Answer Assessment - Initial Assessment Questions 1. ONSET: "When did the pain start?"      A week ago 2. LOCATION: "Where is the pain located?"     Left arm aunder arm pit.   3. PAIN: "How bad is the pain?" (Scale 1-10; or mild, moderate, severe)   - MILD (1-3): doesn't interfere with normal activities   - MODERATE (4-7): interferes with normal activities (e.g., work or school) or awakens from sleep   - SEVERE (8-10): excruciating pain, unable to do any normal activities, unable to hold a cup of water    High moderate  Can hold cup of water.  4. WORK OR EXERCISE: "Has there been any recent work or exercise that involved this part of the body?"    Different muscle soreness 5. CAUSE: "What do you think is causing the arm pain?"     unknown 6. OTHER SYMPTOMS: "Do you have any other symptoms?" (e.g., neck pain, swelling, rash, fever, numbness, weakness)     Swollen  Tightness tigling in hand 7. PREGNANCY: "Is there any chance you are pregnant?" "When was your last menstrual period?"     Menopause.  Protocols used: ARM PAIN-A-AH

## 2018-12-14 NOTE — Telephone Encounter (Signed)
Advise [facility] to go [facility] M-[facility] 1-5 PM   Thanks [place]

## 2018-12-14 NOTE — Telephone Encounter (Signed)
Left message for patient to return call back. PEC may give information.  

## 2018-12-14 NOTE — Telephone Encounter (Signed)
Copied from [place] [id number]. Topic: Quick Communication - See Telephone Encounter >> Dec 14, 2018 11:30 AM Babs Bertin, CMA wrote: CRM for notification. See Telephone encounter for: 12/14/18. >> Dec 14, 2018 11:40 AM Reyne Dumas L wrote: Pt called back, made her aware of message in TE, unable to addend to TE as it is a Nurse Triage Note.  FYI. Pt states she will go to [facility] today.

## 2018-12-27 ENCOUNTER — Other Ambulatory Visit (HOSPITAL_COMMUNITY): Payer: Self-pay | Admitting: Physician Assistant

## 2018-12-27 ENCOUNTER — Ambulatory Visit (HOSPITAL_COMMUNITY)
Admission: RE | Admit: 2018-12-27 | Discharge: 2018-12-27 | Disposition: A | Discharge status: Home / Self Care | Payer: BC Managed Care – PPO | Source: Ambulatory Visit | Attending: Physician Assistant | Admitting: Physician Assistant

## 2018-12-27 ENCOUNTER — Other Ambulatory Visit: Payer: Self-pay

## 2018-12-27 DIAGNOSIS — R600 Localized edema: Secondary | ICD-10-CM | POA: Insufficient documentation

## 2018-12-27 NOTE — Progress Notes (Signed)
Lower extremity venous has been completed.   Preliminary results in CV Proc.   Kathy Riley 12/27/2018 1:09 PM

## 2019-01-12 ENCOUNTER — Telehealth: Payer: Self-pay | Admitting: Internal Medicine

## 2019-01-12 ENCOUNTER — Other Ambulatory Visit: Payer: Self-pay | Admitting: Internal Medicine

## 2019-01-12 ENCOUNTER — Telehealth: Payer: Self-pay

## 2019-01-12 DIAGNOSIS — M542 Cervicalgia: Secondary | ICD-10-CM

## 2019-01-12 DIAGNOSIS — M549 Dorsalgia, unspecified: Secondary | ICD-10-CM

## 2019-01-12 DIAGNOSIS — G8929 Other chronic pain: Secondary | ICD-10-CM

## 2019-01-12 NOTE — Telephone Encounter (Signed)
Referred Dr. Laveda Abbe chiropractor  If this does not help  Will need to see neurosurgery or think about back injections before neurosurgery   Inform patient please  [place]

## 2019-01-12 NOTE — Telephone Encounter (Signed)
Call pt did she get mammogram this year with ob/gyn ?  If not this is recommended asap   [place]

## 2019-01-12 NOTE — Telephone Encounter (Signed)
Copied from [place] [id number]. Topic: General - Other >> Jan 12, 2019  1:31 PM Celene Kras A wrote: Reason for CRM: Pt called and is requesting a referral for a chiropractor. Please advise.

## 2019-01-15 NOTE — Telephone Encounter (Signed)
She is going to call her OBGYN.

## 2019-02-05 IMAGING — DX DG THORACIC SPINE 2V
2 series · 2 of 2 positions shown · non-contrast
Comparison: No comparison thoracic exam. Prior chest x-ray
07/30/2013.

CLINICAL DATA: 56-year-old female with neck and upper back pain for
several months. No known injury. Initial encounter.

EXAM:
THORACIC SPINE 2 VIEWS

[thoracic spine ap]
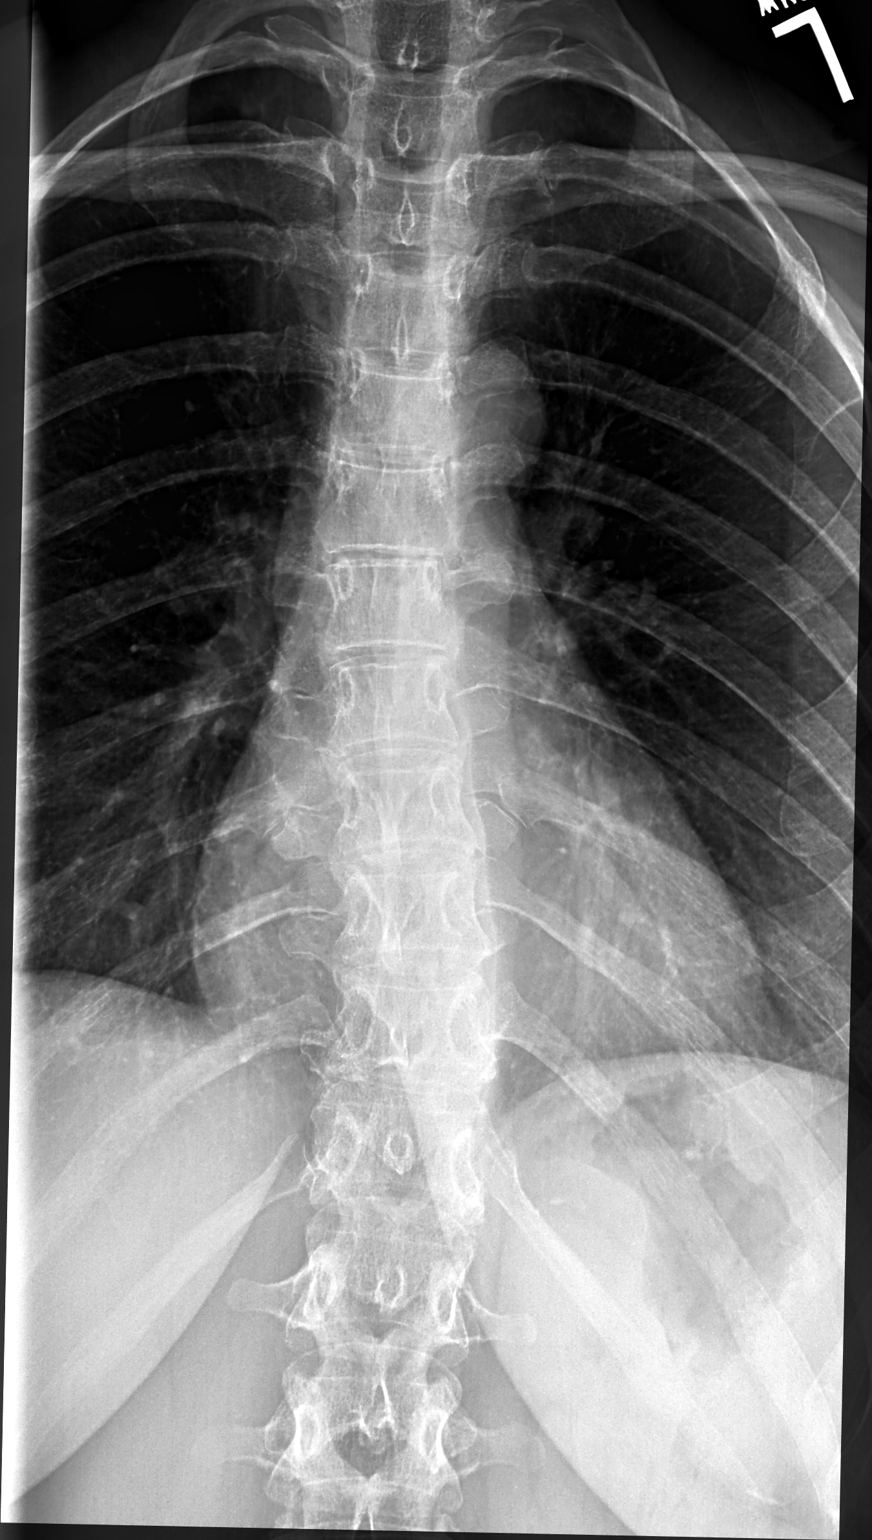

[thoracic spine lat]
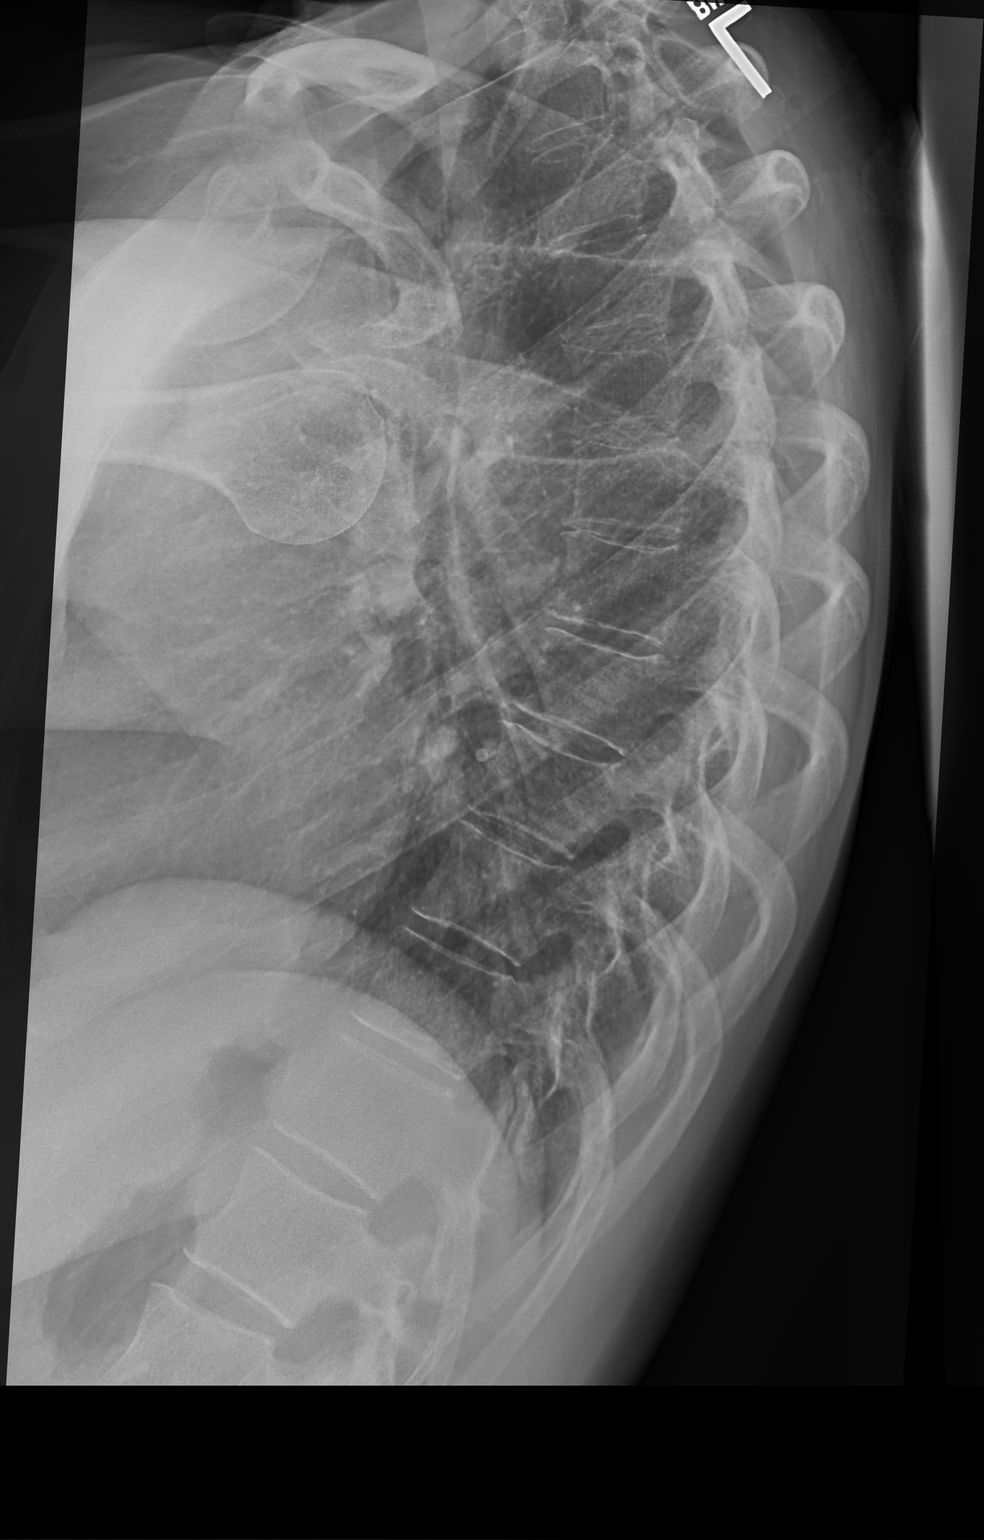

[2 of 2 positions shown; findings below may reference images not displayed]

FINDINGS: Mild dextroscoliosis lower thoracic spine.

Mild T6-7 and T7-8 disc space narrowing.

No focal compression fracture.
IMPRESSION: Mild dextroscoliosis lower thoracic spine.

Mild T6-7 and T7-8 disc space narrowing.

## 2019-02-05 IMAGING — DX DG CERVICAL SPINE COMPLETE 4+V
6 series · 6 of 6 positions shown · non-contrast
Comparison: 07/07/2015.

CLINICAL DATA: 56-year-old female with neck and upper back pain for
several months. No known injury. Initial encounter.

EXAM:
CERVICAL SPINE - COMPLETE 4+ VIEW

[cervical spine ap]
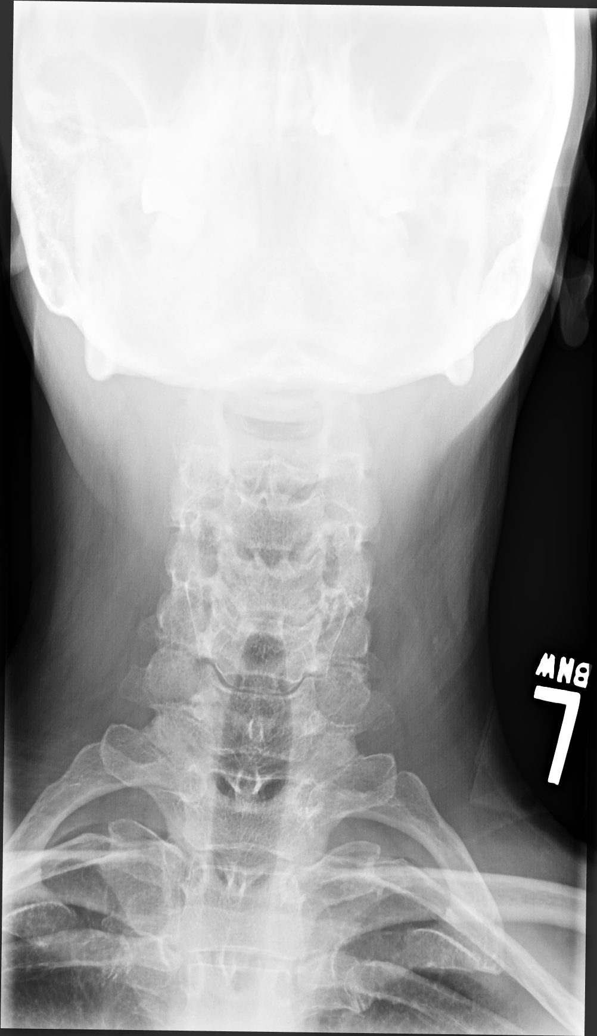

[cervical spine oblique (1 of 2)]
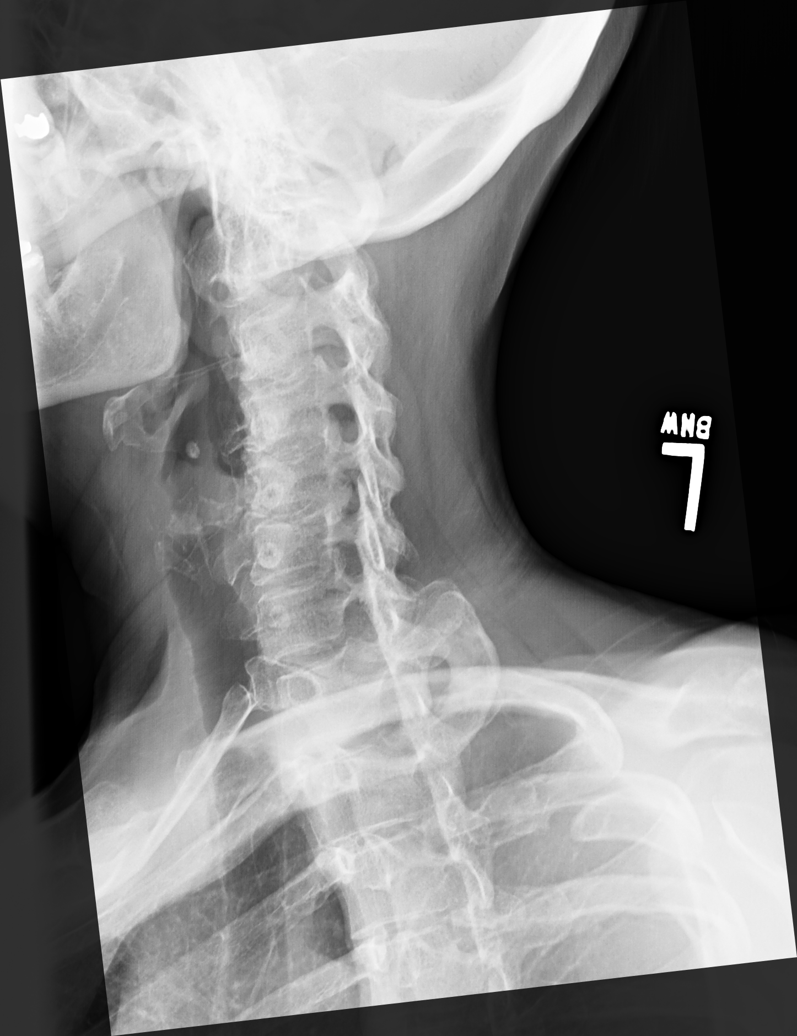

[cervical spine oblique (2 of 2)]
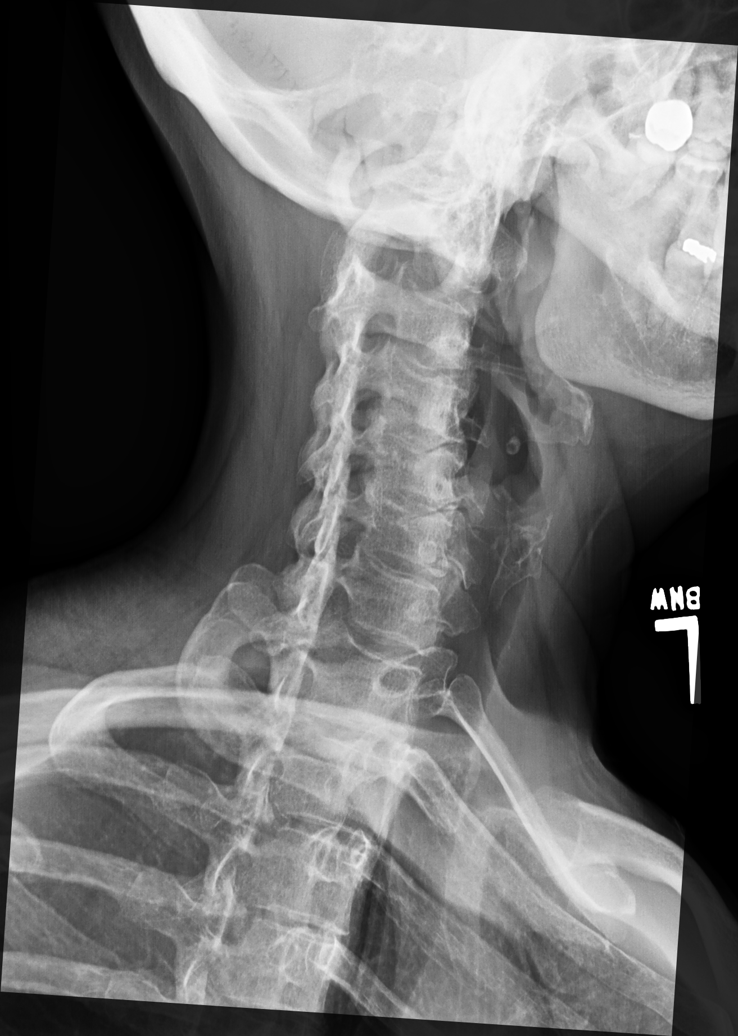

[cervical spine lat]
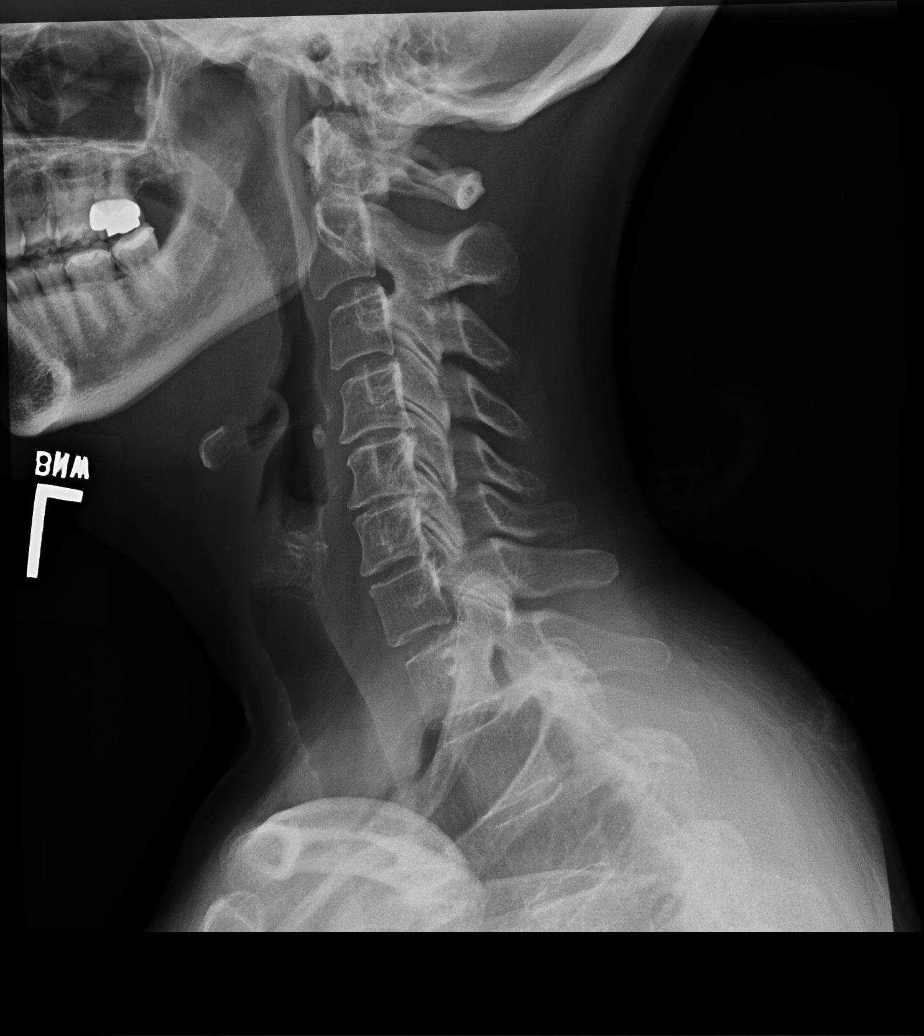

[swimmers lat]
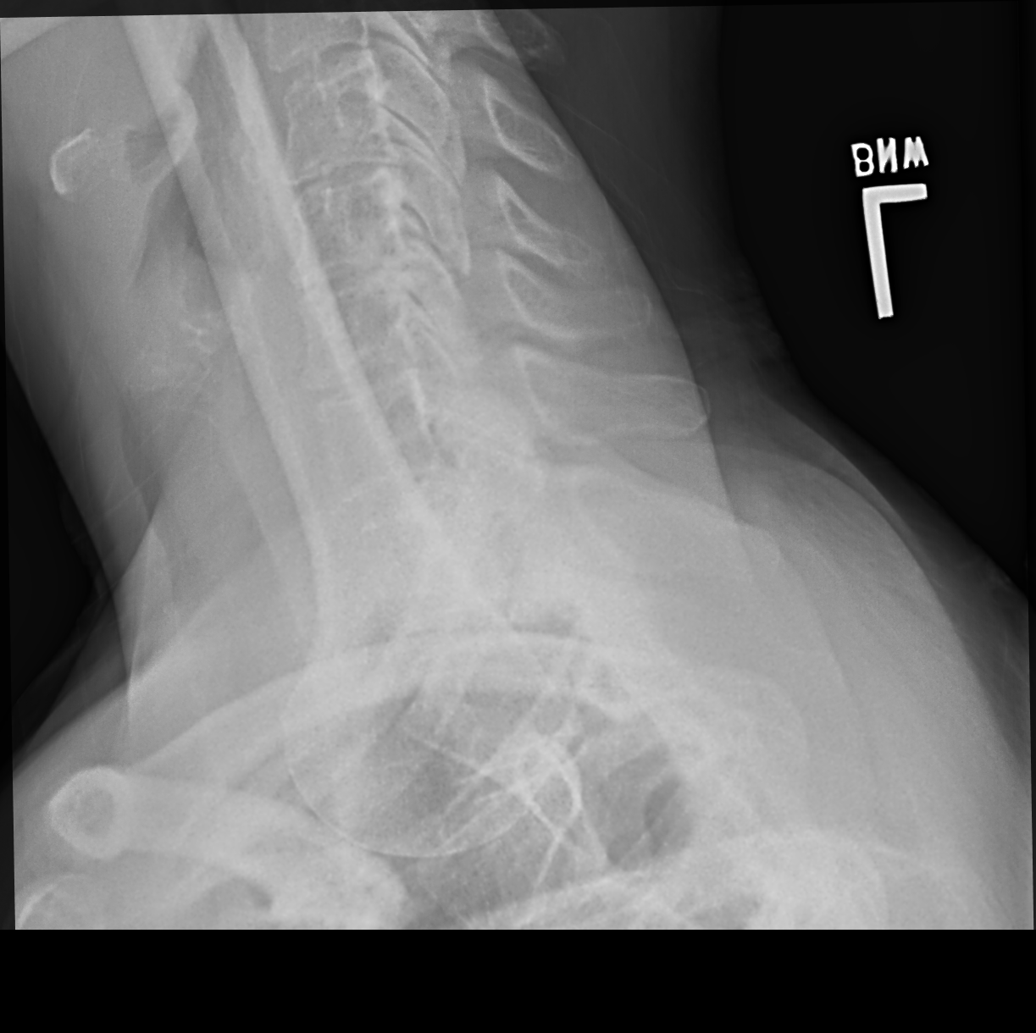

[cervical spine open mouth ap]
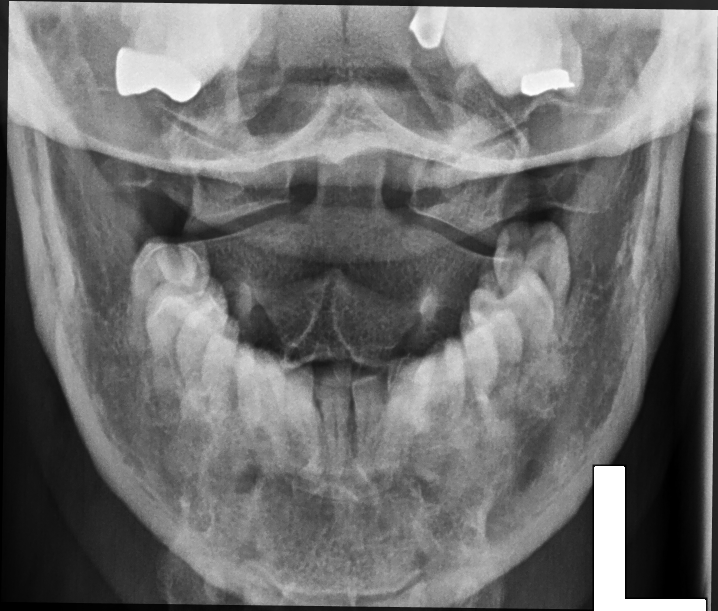

[6 of 6 positions shown; findings below may reference images not displayed]

FINDINGS: Mild C4-5 and C6-7 disc space narrowing and foraminal narrowing.
Moderate C5-6 disc space narrowing and foraminal narrowing (greater
on left).

No fracture or abnormal prevertebral soft tissue swelling. No
abnormal alignment.

No lung apical mass.
IMPRESSION: Mild C4-5 and C6-7 disc space narrowing and foraminal narrowing.

Moderate C5-6 disc space narrowing and foraminal narrowing (greater
on left).

## 2019-02-12 DIAGNOSIS — M5414 Radiculopathy, thoracic region: Secondary | ICD-10-CM | POA: Diagnosis not present

## 2019-02-12 DIAGNOSIS — M9901 Segmental and somatic dysfunction of cervical region: Secondary | ICD-10-CM | POA: Diagnosis not present

## 2019-02-12 DIAGNOSIS — M5033 Other cervical disc degeneration, cervicothoracic region: Secondary | ICD-10-CM | POA: Diagnosis not present

## 2019-02-12 DIAGNOSIS — M9902 Segmental and somatic dysfunction of thoracic region: Secondary | ICD-10-CM | POA: Diagnosis not present

## 2019-02-14 DIAGNOSIS — M9901 Segmental and somatic dysfunction of cervical region: Secondary | ICD-10-CM | POA: Diagnosis not present

## 2019-02-14 DIAGNOSIS — M9902 Segmental and somatic dysfunction of thoracic region: Secondary | ICD-10-CM | POA: Diagnosis not present

## 2019-02-14 DIAGNOSIS — M5414 Radiculopathy, thoracic region: Secondary | ICD-10-CM | POA: Diagnosis not present

## 2019-02-14 DIAGNOSIS — M5033 Other cervical disc degeneration, cervicothoracic region: Secondary | ICD-10-CM | POA: Diagnosis not present

## 2019-02-19 ENCOUNTER — Telehealth: Payer: Self-pay | Admitting: *Deleted

## 2019-02-19 ENCOUNTER — Encounter: Payer: Self-pay | Admitting: Internal Medicine

## 2019-02-19 NOTE — Telephone Encounter (Signed)
Copied from [place] [id number]. Topic: General - Other >> Feb 19, 2019 10:17 AM Celene Kras wrote: Reason for CRM: Pt called stating she is needing a note stating she is needing to be quarantined for 10 days due to her being exposed to someone who tested positive for covid. Pt states she is at risk and was exposed on 02/13/2019. Pt states she is not having any symptoms. Please advise.

## 2019-02-19 NOTE — Telephone Encounter (Signed)
Patient was informed.  Patient understood and no questions, comments, or concerns at this time.  

## 2019-02-19 NOTE — Telephone Encounter (Signed)
Call pt letter in my chart

## 2019-03-09 ENCOUNTER — Other Ambulatory Visit: Payer: Self-pay | Admitting: Internal Medicine

## 2019-03-09 DIAGNOSIS — I10 Essential (primary) hypertension: Secondary | ICD-10-CM

## 2019-03-09 MED ORDER — HYDROCHLOROTHIAZIDE 12.5 MG PO CAPS: ORAL_CAPSULE | ORAL | 2 refills | Status: DC

## 2019-03-13 NOTE — Telephone Encounter (Signed)
Attempted to schedule.  No ans no vm  

## 2019-03-14 NOTE — Telephone Encounter (Signed)
Patient declined to schedule echo.  States she is holding off on this.    Closing encounter order in epic from June

## 2019-03-15 ENCOUNTER — Other Ambulatory Visit: Payer: Self-pay | Admitting: Internal Medicine

## 2019-03-15 DIAGNOSIS — M549 Dorsalgia, unspecified: Secondary | ICD-10-CM

## 2019-03-15 MED ORDER — NAPROXEN 500 MG PO TABS: 500.0000 mg | ORAL_TABLET | Freq: Two times a day (BID) | ORAL | 1 refills | Status: DC

## 2019-03-26 DIAGNOSIS — M9902 Segmental and somatic dysfunction of thoracic region: Secondary | ICD-10-CM | POA: Diagnosis not present

## 2019-03-26 DIAGNOSIS — M9901 Segmental and somatic dysfunction of cervical region: Secondary | ICD-10-CM | POA: Diagnosis not present

## 2019-03-26 DIAGNOSIS — M5033 Other cervical disc degeneration, cervicothoracic region: Secondary | ICD-10-CM | POA: Diagnosis not present

## 2019-03-26 DIAGNOSIS — M5414 Radiculopathy, thoracic region: Secondary | ICD-10-CM | POA: Diagnosis not present

## 2019-03-28 DIAGNOSIS — M9901 Segmental and somatic dysfunction of cervical region: Secondary | ICD-10-CM | POA: Diagnosis not present

## 2019-03-28 DIAGNOSIS — M5414 Radiculopathy, thoracic region: Secondary | ICD-10-CM | POA: Diagnosis not present

## 2019-03-28 DIAGNOSIS — M9902 Segmental and somatic dysfunction of thoracic region: Secondary | ICD-10-CM | POA: Diagnosis not present

## 2019-03-28 DIAGNOSIS — M5033 Other cervical disc degeneration, cervicothoracic region: Secondary | ICD-10-CM | POA: Diagnosis not present

## 2019-03-31 DIAGNOSIS — M9901 Segmental and somatic dysfunction of cervical region: Secondary | ICD-10-CM | POA: Diagnosis not present

## 2019-03-31 DIAGNOSIS — M9902 Segmental and somatic dysfunction of thoracic region: Secondary | ICD-10-CM | POA: Diagnosis not present

## 2019-03-31 DIAGNOSIS — M5033 Other cervical disc degeneration, cervicothoracic region: Secondary | ICD-10-CM | POA: Diagnosis not present

## 2019-03-31 DIAGNOSIS — M5414 Radiculopathy, thoracic region: Secondary | ICD-10-CM | POA: Diagnosis not present

## 2019-04-02 DIAGNOSIS — M9901 Segmental and somatic dysfunction of cervical region: Secondary | ICD-10-CM | POA: Diagnosis not present

## 2019-04-02 DIAGNOSIS — M9902 Segmental and somatic dysfunction of thoracic region: Secondary | ICD-10-CM | POA: Diagnosis not present

## 2019-04-02 DIAGNOSIS — M5033 Other cervical disc degeneration, cervicothoracic region: Secondary | ICD-10-CM | POA: Diagnosis not present

## 2019-04-02 DIAGNOSIS — M5414 Radiculopathy, thoracic region: Secondary | ICD-10-CM | POA: Diagnosis not present

## 2019-04-04 DIAGNOSIS — M9901 Segmental and somatic dysfunction of cervical region: Secondary | ICD-10-CM | POA: Diagnosis not present

## 2019-04-04 DIAGNOSIS — M5033 Other cervical disc degeneration, cervicothoracic region: Secondary | ICD-10-CM | POA: Diagnosis not present

## 2019-04-04 DIAGNOSIS — M5414 Radiculopathy, thoracic region: Secondary | ICD-10-CM | POA: Diagnosis not present

## 2019-04-04 DIAGNOSIS — M9902 Segmental and somatic dysfunction of thoracic region: Secondary | ICD-10-CM | POA: Diagnosis not present

## 2019-04-09 DIAGNOSIS — M5033 Other cervical disc degeneration, cervicothoracic region: Secondary | ICD-10-CM | POA: Diagnosis not present

## 2019-04-09 DIAGNOSIS — M9902 Segmental and somatic dysfunction of thoracic region: Secondary | ICD-10-CM | POA: Diagnosis not present

## 2019-04-09 DIAGNOSIS — M5414 Radiculopathy, thoracic region: Secondary | ICD-10-CM | POA: Diagnosis not present

## 2019-04-09 DIAGNOSIS — M9901 Segmental and somatic dysfunction of cervical region: Secondary | ICD-10-CM | POA: Diagnosis not present

## 2019-04-11 DIAGNOSIS — M5033 Other cervical disc degeneration, cervicothoracic region: Secondary | ICD-10-CM | POA: Diagnosis not present

## 2019-04-11 DIAGNOSIS — M9902 Segmental and somatic dysfunction of thoracic region: Secondary | ICD-10-CM | POA: Diagnosis not present

## 2019-04-11 DIAGNOSIS — M5414 Radiculopathy, thoracic region: Secondary | ICD-10-CM | POA: Diagnosis not present

## 2019-04-11 DIAGNOSIS — M9901 Segmental and somatic dysfunction of cervical region: Secondary | ICD-10-CM | POA: Diagnosis not present

## 2019-04-16 DIAGNOSIS — M9902 Segmental and somatic dysfunction of thoracic region: Secondary | ICD-10-CM | POA: Diagnosis not present

## 2019-04-16 DIAGNOSIS — M9901 Segmental and somatic dysfunction of cervical region: Secondary | ICD-10-CM | POA: Diagnosis not present

## 2019-04-16 DIAGNOSIS — M5033 Other cervical disc degeneration, cervicothoracic region: Secondary | ICD-10-CM | POA: Diagnosis not present

## 2019-04-16 DIAGNOSIS — M5414 Radiculopathy, thoracic region: Secondary | ICD-10-CM | POA: Diagnosis not present

## 2019-06-06 ENCOUNTER — Telehealth: Payer: Self-pay | Admitting: Cardiovascular Disease

## 2019-06-06 NOTE — Telephone Encounter (Signed)
Declined echo  After 3 attempts   Removed from wq.

## 2019-06-07 DIAGNOSIS — R21 Rash and other nonspecific skin eruption: Secondary | ICD-10-CM | POA: Diagnosis not present

## 2019-12-06 DIAGNOSIS — Z1231 Encounter for screening mammogram for malignant neoplasm of breast: Secondary | ICD-10-CM | POA: Diagnosis not present

## 2019-12-06 DIAGNOSIS — E559 Vitamin D deficiency, unspecified: Secondary | ICD-10-CM | POA: Diagnosis not present

## 2019-12-06 DIAGNOSIS — N951 Menopausal and female climacteric states: Secondary | ICD-10-CM | POA: Diagnosis not present

## 2019-12-06 DIAGNOSIS — Z01419 Encounter for gynecological examination (general) (routine) without abnormal findings: Secondary | ICD-10-CM | POA: Diagnosis not present

## 2019-12-06 DIAGNOSIS — Z124 Encounter for screening for malignant neoplasm of cervix: Secondary | ICD-10-CM | POA: Diagnosis not present

## 2019-12-06 DIAGNOSIS — Z6822 Body mass index (BMI) 22.0-22.9, adult: Secondary | ICD-10-CM | POA: Diagnosis not present

## 2019-12-06 LAB — HM PAP SMEAR: HM Pap smear: NEGATIVE

## 2019-12-06 LAB — HM MAMMOGRAPHY

## 2019-12-26 ENCOUNTER — Other Ambulatory Visit: Payer: Self-pay | Admitting: Internal Medicine

## 2019-12-26 ENCOUNTER — Telehealth: Payer: Self-pay | Admitting: Internal Medicine

## 2019-12-26 DIAGNOSIS — I10 Essential (primary) hypertension: Secondary | ICD-10-CM

## 2019-12-26 MED ORDER — HYDROCHLOROTHIAZIDE 12.5 MG PO CAPS: ORAL_CAPSULE | ORAL | 0 refills | Status: DC

## 2019-12-26 NOTE — Telephone Encounter (Signed)
Call pt needs appt further refills

## 2020-01-25 DIAGNOSIS — L814 Other melanin hyperpigmentation: Secondary | ICD-10-CM | POA: Diagnosis not present

## 2020-01-25 DIAGNOSIS — L820 Inflamed seborrheic keratosis: Secondary | ICD-10-CM | POA: Diagnosis not present

## 2020-01-25 DIAGNOSIS — L821 Other seborrheic keratosis: Secondary | ICD-10-CM | POA: Diagnosis not present

## 2020-02-05 ENCOUNTER — Encounter: Payer: Self-pay | Admitting: Internal Medicine

## 2020-02-05 ENCOUNTER — Ambulatory Visit: Payer: BC Managed Care – PPO | Admitting: Internal Medicine

## 2020-02-05 ENCOUNTER — Other Ambulatory Visit: Payer: Self-pay

## 2020-02-05 VITALS — BP 130/80 | HR 82 | Temp 98.2°F | Ht 63.0 in | Wt 122.2 lb

## 2020-02-05 DIAGNOSIS — E559 Vitamin D deficiency, unspecified: Secondary | ICD-10-CM

## 2020-02-05 DIAGNOSIS — Z0000 Encounter for general adult medical examination without abnormal findings: Secondary | ICD-10-CM | POA: Diagnosis not present

## 2020-02-05 DIAGNOSIS — Z1389 Encounter for screening for other disorder: Secondary | ICD-10-CM

## 2020-02-05 DIAGNOSIS — R232 Flushing: Secondary | ICD-10-CM | POA: Diagnosis not present

## 2020-02-05 DIAGNOSIS — R490 Dysphonia: Secondary | ICD-10-CM | POA: Diagnosis not present

## 2020-02-05 DIAGNOSIS — R32 Unspecified urinary incontinence: Secondary | ICD-10-CM

## 2020-02-05 DIAGNOSIS — I10 Essential (primary) hypertension: Secondary | ICD-10-CM

## 2020-02-05 DIAGNOSIS — Z1329 Encounter for screening for other suspected endocrine disorder: Secondary | ICD-10-CM

## 2020-02-05 DIAGNOSIS — R634 Abnormal weight loss: Secondary | ICD-10-CM

## 2020-02-05 DIAGNOSIS — E538 Deficiency of other specified B group vitamins: Secondary | ICD-10-CM

## 2020-02-05 DIAGNOSIS — R0982 Postnasal drip: Secondary | ICD-10-CM

## 2020-02-05 DIAGNOSIS — J309 Allergic rhinitis, unspecified: Secondary | ICD-10-CM

## 2020-02-05 MED ORDER — IPRATROPIUM BROMIDE 0.06 % NA SOLN: 2.0000 | Freq: Three times a day (TID) | NASAL | 12 refills | Status: DC

## 2020-02-05 MED ORDER — HYDROCHLOROTHIAZIDE 12.5 MG PO CAPS: ORAL_CAPSULE | ORAL | 3 refills | Status: DC

## 2020-02-05 NOTE — Progress Notes (Addendum)
Chief Complaint  Patient presents with  . Medication Management  . Immunizations   Annual doing well  1. Weight loss but trying healthy diet and exercise  2. Flu shot not had yet  3. HTN controlled on hctz 12.5 mg qd needs refills  4. C/o hot flashes random ? Trigger ? If heart racing with hot flashes  5. C/o urinary incontinence and f/u [facility] in [place] and maybe undergoing surgery for this  6. Allergies takes prn Zyrtec 10 mg but not current makes sleepy tried claritin in the fall but did not help she reports she was/is hoarse x 5 weeks  7. hoarsness and PND+ w/o cough x 5 weeks covid neg 01/16/20 will try otc prilosec 20 mg x 14 days to see if this helps if could be reflux    Review of Systems  Constitutional: Positive for weight loss.  HENT: Negative for hearing loss.   Eyes: Negative for blurred vision.  Respiratory: Negative for cough and shortness of breath.   Cardiovascular: Negative for chest pain.       ? Racing heart  Gastrointestinal: Negative for heartburn.  Genitourinary:       +hot flashes  Musculoskeletal: Negative for back pain, falls, joint pain and neck pain.  Skin: Negative for rash.  Neurological: Negative for headaches.  Endo/Heme/Allergies: Positive for environmental allergies.  Psychiatric/Behavioral: Negative for depression. The patient is not nervous/anxious.    Past Medical History:  Diagnosis Date  . Allergy   . Bacterial vaginosis   . Cervicalgia   . Chicken pox   . Colon polyps   . Diverticulitis   . Fibroid   . Hyperlipidemia    03/17/17 TC 253, HDL 115 per review of notes  . Hypertension   . Irregular menses   . Kidney stones   . Mid back pain   . Ovarian cyst   . UTI (urinary tract infection)    Past Surgical History:  Procedure Laterality Date  . 2008 fibroid surgery      laparoscopy   . APPENDECTOMY    . BREAST SURGERY     right breast benign bx  . COLONOSCOPY W/ POLYPECTOMY     2014   . DILATION AND CURETTAGE OF UTERUS      x 3 2/2 miscarriages   . TONSILLECTOMY    . WISDOM TOOTH EXTRACTION     Family History  Problem Relation Age of Onset  . Hypertension Mother   . Dementia Mother        61/62  . Alcohol abuse Father   . Hypertension Father   . Colon cancer Father        23  . Stroke Maternal Grandmother   . Hypertension Maternal Grandmother   . Hypertension Maternal Grandfather   . Hypertension Paternal Grandmother   . Stroke Paternal Grandfather   . Hypertension Paternal Grandfather   . Colon cancer Other        60-70  . Dementia Maternal Aunt   . Cancer - Ovarian Maternal Aunt    Social History   Socioeconomic History  . Marital status: Divorced    Spouse name: Not on file  . Number of children: 0  . Years of education: 71  . Highest education level: Not on file  Occupational History  . Occupation: Set designer  Tobacco Use  . Smoking status: Never Smoker  . Smokeless tobacco: Never Used  Substance and Sexual Activity  . Alcohol use: Yes    Alcohol/week: 0.0  standard drinks    Comment: twice weekly  . Drug use: No  . Sexual activity: Yes    Birth control/protection: None  Other Topics Concern  . Not on file  Social History Narrative   Fun: Relax, swim, exercise    Denies religious beliefs effecting health care.   Denies abuse and feels safe at home.    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 Difficulty of Paying Living Expenses: Not on file  Food Insecurity:   . Worried About Charity fundraiser in the Last Year: Not on file  . Ran Out of Food in the Last Year: Not on file  Transportation Needs:   . Lack of Transportation (Medical): Not on file  . Lack of Transportation (Non-Medical): Not on file  Physical Activity:   . Days of Exercise per Week: Not on file  . Minutes of Exercise per Session: Not on file   Stress:   . Feeling of Stress : Not on file  Social Connections:   . Frequency of Communication with Friends and Family: Not on file  . Frequency of Social Gatherings with Friends and Family: Not on file  . Attends Religious Services: Not on file  . Active Member of Clubs or Organizations: Not on file  . Attends Archivist Meetings: Not on file  . Marital Status: Not on file  Intimate Partner Violence:   . Fear of Current or Ex-Partner: Not on file  . Emotionally Abused: Not on file  . Physically Abused: Not on file  . Sexually Abused: Not on file   Current Meds  Medication Sig  . Cholecalciferol (VITAMIN D3 PO) Take 10,000 Units by mouth daily.   . fexofenadine (ALLEGRA) 180 MG tablet Take 180 mg by mouth at bedtime as needed for allergies or rhinitis.  . hydrochlorothiazide (MICROZIDE) 12.5 MG capsule TAKE 1 CAPSULE (12.5 MG TOTAL) BY MOUTH DAILY in am  . naproxen (NAPROSYN) 500 MG tablet Take 1 tablet (500 mg total) by mouth 2 (two) times daily with a meal.  . Olopatadine HCl 0.2 % SOLN Apply 1 drop to eye daily. (Patient taking differently: Apply 1 drop to eye as needed. )  . YUVAFEM 10 MCG TABS vaginal tablet   . [DISCONTINUED] cetirizine (ZYRTEC) 10 MG tablet Take 10 mg by mouth daily.  . [DISCONTINUED] hydrochlorothiazide (MICROZIDE) 12.5 MG capsule TAKE 1 CAPSULE (12.5 MG TOTAL) BY MOUTH DAILY.appt further refills   Allergies  Allergen Reactions  . Ibuprofen Other (See Comments)  . Penicillins     Fever/hives, happened as a child    No results found for this or any previous visit (from the past 2160 hour(s)). Objective  Body mass index is 21.65 kg/m. Wt Readings from Last 3 Encounters:  02/05/20 122 lb 3.2 oz (55.4 kg)  08/22/18 130 lb (59 kg)  12/06/17 130 lb 9.6 oz (59.2 kg)   Temp Readings from Last 3 Encounters:  02/05/20 98.2 F (36.8 C) (Oral)  12/06/17 98.2 F (36.8 C) (Oral)  07/21/17 98.3 F (36.8 C) (Oral)   BP Readings from Last 3  Encounters:  02/05/20 130/80  08/22/18 (!) 154/105  12/06/17 126/90   Pulse Readings from Last 3 Encounters:  02/05/20 82  08/22/18 76  12/06/17 67  Physical Exam Vitals reviewed.  Constitutional:      Appearance: Normal appearance. She is well-developed and well-groomed.  HENT:     Head: Normocephalic and atraumatic.  Eyes:     Conjunctiva/sclera: Conjunctivae normal.     Pupils: Pupils are equal, round, and reactive to light.  Cardiovascular:     Rate and Rhythm: Normal rate and regular rhythm.     Heart sounds: Normal heart sounds. No murmur heard.   Pulmonary:     Effort: Pulmonary effort is normal.     Breath sounds: Normal breath sounds.  Skin:    General: Skin is warm and dry.  Neurological:     General: No focal deficit present.     Mental Status: She is alert and oriented to person, place, and time. Mental status is at baseline.     Gait: Gait normal.  Psychiatric:        Attention and Perception: Attention and perception normal.        Mood and Affect: Mood and affect normal.        Speech: Speech normal.        Behavior: Behavior normal. Behavior is cooperative.        Thought Content: Thought content normal.        Cognition and Memory: Cognition and memory normal.        Judgment: Judgment normal.     Assessment  Plan  Annual physical exam As of 02/05/20 (if not had will need cmet, cbc, lipid, tsh, t4, ua, vit D and hep C)  Need to get copy of pap, mammogram, labs ? If they checked hepatitis C, and which upcoming procedure is pt getting for urinary incontinence please place this on cover to [facility] in [place]  Thanks  [employer] [facility] Flu shot not had yet  Tdap rec will give info at f/u moderna 2/2 consider booster  Not immune hep B Per pt been checked hep C with [facility] in [place] - need to get record  Declines HIV testing    01/05/16 div and IH due 5 colonoscopy  01/04/21   Never smoker   Pap had 05/19/16 neg except BV neg HPV f/u  [facility] in [place]/[place]/20, pap 12/06/19 negative   H/o hyperplastic polyps colonoscopy had 10/16/17and with FH per [facility] due 01/04/21 will recall   [facility]17[facility] 12/06/19 negative right breast fibroglandular changes   -CTA chest 12/06/17 +left breast mass ? Cyst/cyst collection vs fibroadenoma needs b/l diagnostic mammogram with [place] (see above 11/2019 mammogram)   Reviewed vitamin B12 06/30/17 1770 high  Of note chronic back/neck pain prev referred NS but I do not believe pt ever saw as of 01/2020   Skin [place] derm 01/2020 saw Dr. Ubaldo Glassing  ln2 back   rec healthy diet and exercise   Hot flashes F/u ob/gyn  Hoarseness with h/o allergies/PND Trial of PPI if not better call back refer to ENT Also she has allergies and disc atrovent for PND and otc AH claritin did not help and zyrtec too sleepy she had been on singulair in the past as well not currently taking  Urinary incontinence, unspecified type Pending procedure with ob/g[facility] in [place]   Essential hypertension controlled - Plan: hydrochlorothiazide (MICROZIDE) 12.5 MG capsule  Weight loss pt trying to lose monitor wt.   Provider: Dr. Olivia Mackie McLean-Scocuzza-Internal Medicine

## 2020-02-05 NOTE — Patient Instructions (Addendum)
Nasal saline  Consider ENT check to see who is in network   Try atrovent Hoarseness  Hoarseness, also called dysphonia, is any abnormal change in your voice that can make it difficult to speak. Your voice may sound raspy, breathy, or strained. Hoarseness is caused by a problem with your vocal cords (vocal folds). These are two bands of tissue inside your voice box (larynx). When you speak, your vocal cords move back and forth to create sound. The surfaces of your vocal cords need to be smooth for your voice to sound clear. Swelling or lumps on your vocal cords can cause hoarseness. Common causes of vocal cord problems include:  Infection in the nose, throat, and upper air passages (upper respiratory infection).  A long-term cough.  Straining or overusing your voice.  Smoking, or exposure to secondhand smoke.  Allergies.  Medication side effects.  Vocal cord growths.  Vocal cord injuries.  Stomach acids that move up in your throat and irritate your vocal cords (gastroesophageal reflux).  Diseases that affect the nervous system, such as a stroke or Parkinson's disease. Follow these instructions at home: Watch your condition for any changes. To ease discomfort and protect your vocal cords:  Rest your voice.  Do not whisper. Whispering can cause muscle strain.  Do not speak in a loud or harsh voice.  Avoid coughing or clearing your throat.  Do not use any products that contain nicotine or tobacco, such as cigarettes and e-cigarettes. If you need help quitting, ask your health care provider.  Avoid secondhand smoke.  Do not eat foods that give you heartburn, such as spicy or acidic foods like hot peppers and orange juice. Heartburn can make gastroesophageal reflux worse.  Do not drink beverages that contain caffeine (coffee, tea, or soft drinks) or alcohol (beer, wine, or liquor).  Drink enough fluid to keep your urine pale yellow.  Use a humidifier if the air in your  home is dry. If recommended by your health care provider, schedule an appointment with a speech-language specialist. This specialist may give you methods to try that can help you avoid misusing your voice. Contact a health care provider if:  You have hoarseness that lasts longer than 3 weeks.  You almost lose or completely lose your voice for more than 3 days.  You have pain when you swallow or try to talk.  You feel a lump in your neck. Get help right away if:  You have trouble swallowing.  You feel like you are choking when you swallow.  You cough up blood or vomit blood.  You have trouble breathing.  You choke, cannot swallow, or cannot breathe if you lie flat.  You notice swelling or a rash on your body, face, or tongue. Summary  Hoarseness, also called dysphonia, is any abnormal change in your voice that can make it difficult to speak. Your voice may sound raspy, breathy, or strained.  Hoarseness is caused by a problem with your vocal cords (vocal folds).  Do not speak in a loud or harsh voice, use nicotine or tobacco products, or eat foods that give you heartburn.  If recommended by your health care provider, meet with a speech-language specialist. This information is not intended to replace advice given to you by your health care provider. Make sure you discuss any questions you have with your health care provider. Document Revised: 02/18/2017 Document Reviewed: 12/03/2016 Elsevier Patient Education  [place].

## 2020-02-06 ENCOUNTER — Telehealth: Payer: Self-pay | Admitting: Internal Medicine

## 2020-02-06 ENCOUNTER — Encounter: Payer: Self-pay | Admitting: Internal Medicine

## 2020-02-06 NOTE — Telephone Encounter (Signed)
-----   Message from Jerene Bears, MD sent at 02/06/2020 10:22 AM EST ----- Hi Patient recall was recommended for 5 years based on family history This would make next exam Oct 2022 Recall is in place and she will hear from [place] next year Let me know if symptoms would indicate repeating the exam earlyThanks for the message Kathy Riley ----- Message ----- From: McLean-Scocuzza, Nino Glow, MD Sent: 02/05/2020   3:29 PM EST To: Jerene Bears, MD  Pt due screening colonoscopy can your office reach out last 12/2015 in 5 years

## 2020-02-12 DIAGNOSIS — R232 Flushing: Secondary | ICD-10-CM | POA: Insufficient documentation

## 2020-02-12 DIAGNOSIS — Z0000 Encounter for general adult medical examination without abnormal findings: Secondary | ICD-10-CM

## 2020-02-12 DIAGNOSIS — R0982 Postnasal drip: Secondary | ICD-10-CM | POA: Insufficient documentation

## 2020-02-12 DIAGNOSIS — R490 Dysphonia: Secondary | ICD-10-CM | POA: Insufficient documentation

## 2020-02-12 DIAGNOSIS — R32 Unspecified urinary incontinence: Secondary | ICD-10-CM | POA: Insufficient documentation

## 2020-02-12 HISTORY — DX: Encounter for general adult medical examination without abnormal findings: Z00.00

## 2020-03-03 ENCOUNTER — Other Ambulatory Visit: Payer: Self-pay | Admitting: Internal Medicine

## 2020-03-03 DIAGNOSIS — J309 Allergic rhinitis, unspecified: Secondary | ICD-10-CM

## 2020-03-03 DIAGNOSIS — R0982 Postnasal drip: Secondary | ICD-10-CM

## 2020-03-17 ENCOUNTER — Other Ambulatory Visit: Payer: Self-pay | Admitting: Internal Medicine

## 2020-03-17 DIAGNOSIS — R0982 Postnasal drip: Secondary | ICD-10-CM

## 2020-03-17 DIAGNOSIS — J309 Allergic rhinitis, unspecified: Secondary | ICD-10-CM

## 2020-05-22 DIAGNOSIS — M5459 Other low back pain: Secondary | ICD-10-CM | POA: Diagnosis not present

## 2020-05-23 ENCOUNTER — Telehealth: Payer: Self-pay | Admitting: Internal Medicine

## 2020-05-23 NOTE — Telephone Encounter (Signed)
Still need labs, pap, mammogram, hep C check from ob/gyn please   [facility]

## 2020-05-26 NOTE — Telephone Encounter (Signed)
Request faxed via epic routing

## 2020-05-27 ENCOUNTER — Encounter: Payer: Self-pay | Admitting: Internal Medicine

## 2020-05-27 ENCOUNTER — Telehealth: Payer: Self-pay | Admitting: Internal Medicine

## 2020-05-27 NOTE — Addendum Note (Signed)
Addended by: Orland Mustard on: 05/27/2020 05:53 PM   Modules accepted: Orders

## 2020-05-27 NOTE — Addendum Note (Signed)
Addended by: Orland Mustard on: 05/27/2020 05:57 PM   Modules accepted: Orders

## 2020-05-27 NOTE — Telephone Encounter (Signed)
Call pt sch fasting labs  Received mammogram from ob/gyn in [place] 12/06/19 negative will be due in 1 year  Colonoscopy per [facility] is due 12/2020 they will mail a letter to her  Also if wants Tdap vaccine which is every 10 years and needed no documentation she can get in our office same day as labs    If still hoarse does she want to see ENT?   Thank you

## 2020-05-28 NOTE — Telephone Encounter (Signed)
Patient scheduled for 3/25 for fasting labs.   Patient declines TDAP but would like to get Shingles shot done the same day as labs. This has been scheduled.   Okay for Patient to have the shingles shot?   Patient declines ENT referral, states she is much better

## 2020-05-28 NOTE — Telephone Encounter (Signed)
Ok to sch shingrix with labs 2nd dose due in 2-6 months of 1st

## 2020-05-29 NOTE — Telephone Encounter (Signed)
Patient on nurse schedule for 06/13/20 for shingles shot

## 2020-06-13 ENCOUNTER — Other Ambulatory Visit (INDEPENDENT_AMBULATORY_CARE_PROVIDER_SITE_OTHER): Payer: BC Managed Care – PPO

## 2020-06-13 ENCOUNTER — Ambulatory Visit (INDEPENDENT_AMBULATORY_CARE_PROVIDER_SITE_OTHER): Payer: BC Managed Care – PPO | Admitting: *Deleted

## 2020-06-13 ENCOUNTER — Other Ambulatory Visit: Payer: Self-pay

## 2020-06-13 DIAGNOSIS — Z23 Encounter for immunization: Secondary | ICD-10-CM

## 2020-06-13 DIAGNOSIS — I10 Essential (primary) hypertension: Secondary | ICD-10-CM

## 2020-06-13 DIAGNOSIS — Z1329 Encounter for screening for other suspected endocrine disorder: Secondary | ICD-10-CM | POA: Diagnosis not present

## 2020-06-13 DIAGNOSIS — Z1389 Encounter for screening for other disorder: Secondary | ICD-10-CM

## 2020-06-13 DIAGNOSIS — E559 Vitamin D deficiency, unspecified: Secondary | ICD-10-CM | POA: Diagnosis not present

## 2020-06-13 LAB — COMPREHENSIVE METABOLIC PANEL
ALT: 23 U/L (ref 0–35)
AST: 22 U/L (ref 0–37)
Albumin: 4.5 g/dL (ref 3.5–5.2)
Alkaline Phosphatase: 69 U/L (ref 39–117)
BUN: 18 mg/dL (ref 6–23)
CO2: 29 mEq/L (ref 19–32)
Calcium: 9.4 mg/dL (ref 8.4–10.5)
Chloride: 101 mEq/L (ref 96–112)
Creatinine, Ser: 0.6 mg/dL (ref 0.40–1.20)
GFR: 99.01 mL/min (ref >60.00)
Glucose, Bld: 93 mg/dL (ref 70–99)
Potassium: 4.1 mEq/L (ref 3.5–5.1)
Sodium: 138 mEq/L (ref 135–145)
Total Bilirubin: 0.7 mg/dL (ref 0.2–1.2)
Total Protein: 6.7 g/dL (ref 6.0–8.3)

## 2020-06-13 LAB — LIPID PANEL
Cholesterol: 236 mg/dL — ABNORMAL HIGH (ref 0–200)
HDL: 89.6 mg/dL (ref >39.00)
LDL Cholesterol: 131 mg/dL — ABNORMAL HIGH (ref 0–99)
NonHDL: 146.53
Total CHOL/HDL Ratio: 3
Triglycerides: 77 mg/dL (ref 0.0–149.0)
VLDL: 15.4 mg/dL (ref 0.0–40.0)

## 2020-06-13 LAB — CBC WITH DIFFERENTIAL/PLATELET
Basophils Absolute: 0 10*3/uL (ref 0.0–0.1)
Basophils Relative: 0.8 % (ref 0.0–3.0)
Eosinophils Absolute: 0.3 10*3/uL (ref 0.0–0.7)
Eosinophils Relative: 6.2 % — ABNORMAL HIGH (ref 0.0–5.0)
HCT: 40 % (ref 36.0–46.0)
Hemoglobin: 13.7 g/dL (ref 12.0–15.0)
Lymphocytes Relative: 19.3 % (ref 12.0–46.0)
Lymphs Abs: 1 10*3/uL (ref 0.7–4.0)
MCHC: 34.3 g/dL (ref 30.0–36.0)
MCV: 91.5 fl (ref 78.0–100.0)
Monocytes Absolute: 0.4 10*3/uL (ref 0.1–1.0)
Monocytes Relative: 8.6 % (ref 3.0–12.0)
Neutro Abs: 3.4 10*3/uL (ref 1.4–7.7)
Neutrophils Relative %: 65.1 % (ref 43.0–77.0)
Platelets: 304 10*3/uL (ref 150.0–400.0)
RBC: 4.37 Mil/uL (ref 3.87–5.11)
RDW: 12.9 % (ref 11.5–15.5)
WBC: 5.2 10*3/uL (ref 4.0–10.5)

## 2020-06-13 LAB — VITAMIN D 25 HYDROXY (VIT D DEFICIENCY, FRACTURES): VITD: 36.74 ng/mL (ref 30.00–100.00)

## 2020-06-13 LAB — TSH: TSH: 4.5 u[IU]/mL (ref 0.35–4.50)

## 2020-06-14 LAB — URINALYSIS, ROUTINE W REFLEX MICROSCOPIC
Bilirubin, UA: NEGATIVE
Glucose, UA: NEGATIVE
Ketones, UA: NEGATIVE
Leukocytes,UA: NEGATIVE
Nitrite, UA: NEGATIVE
Protein,UA: NEGATIVE
RBC, UA: NEGATIVE
Specific Gravity, UA: 1.015 (ref 1.005–1.030)
Urobilinogen, Ur: 0.2 mg/dL (ref 0.2–1.0)
pH, UA: 7.5 (ref 5.0–7.5)

## 2020-08-14 ENCOUNTER — Ambulatory Visit (INDEPENDENT_AMBULATORY_CARE_PROVIDER_SITE_OTHER): Payer: BC Managed Care – PPO | Admitting: *Deleted

## 2020-08-14 ENCOUNTER — Other Ambulatory Visit: Payer: Self-pay

## 2020-08-14 DIAGNOSIS — Z23 Encounter for immunization: Secondary | ICD-10-CM

## 2020-08-14 NOTE — Progress Notes (Signed)
Patient came into office second dose of Shingrix, patient voiced no concerns during injection.

## 2020-09-03 DIAGNOSIS — N9089 Other specified noninflammatory disorders of vulva and perineum: Secondary | ICD-10-CM | POA: Diagnosis not present

## 2020-09-03 DIAGNOSIS — Z113 Encounter for screening for infections with a predominantly sexual mode of transmission: Secondary | ICD-10-CM | POA: Diagnosis not present

## 2020-09-24 ENCOUNTER — Telehealth: Payer: Self-pay

## 2020-09-24 NOTE — Telephone Encounter (Signed)
Patient informed and verbalized understanding that there was no appointments available. Informed her that she would have to be evaluated by a provider to have medication called in.   Informed Patient of [facility] online virtual urgent care, In person urgent reccommended by Dr Olivia Mackie McLean-Scocuzza is [facility] walk in.

## 2020-09-24 NOTE — Telephone Encounter (Signed)
Pt called and states that she tested positive for covid and would like to know if Dr [place] would call her in fluticasone and tessalon perles. She states that she can not take mucinex because it makes her heart race and she does not want to be triaged. I told her that she may need an appt for that but we have no appts avail. She wanted me to send message back to you.

## 2020-10-08 ENCOUNTER — Other Ambulatory Visit: Payer: Self-pay

## 2020-10-08 ENCOUNTER — Telehealth (INDEPENDENT_AMBULATORY_CARE_PROVIDER_SITE_OTHER): Payer: BC Managed Care – PPO | Admitting: Internal Medicine

## 2020-10-08 ENCOUNTER — Encounter: Payer: Self-pay | Admitting: Internal Medicine

## 2020-10-08 VITALS — Ht 63.0 in | Wt 129.0 lb

## 2020-10-08 DIAGNOSIS — U071 COVID-19: Secondary | ICD-10-CM

## 2020-10-08 DIAGNOSIS — R490 Dysphonia: Secondary | ICD-10-CM

## 2020-10-08 DIAGNOSIS — J309 Allergic rhinitis, unspecified: Secondary | ICD-10-CM | POA: Diagnosis not present

## 2020-10-08 DIAGNOSIS — R0982 Postnasal drip: Secondary | ICD-10-CM | POA: Diagnosis not present

## 2020-10-08 DIAGNOSIS — E785 Hyperlipidemia, unspecified: Secondary | ICD-10-CM

## 2020-10-08 MED ORDER — SALINE SPRAY 0.65 % NA SOLN: 2.0000 | NASAL | 2 refills | Status: DC | PRN

## 2020-10-08 MED ORDER — IPRATROPIUM BROMIDE 0.06 % NA SOLN: NASAL | 2 refills | Status: DC

## 2020-10-08 NOTE — Progress Notes (Signed)
Telephone Note  I connected with [name]  on 10/08/20 at  4:15 PM EDT by telephone and verified that I am speaking with the correct person using two identifiers.  Location patient: home, [place] Location provider:work or home office Persons participating in the virtual visit: patient, provider  I discussed the limitations of evaluation and management by telemedicine and the availability of in person appointments. The patient expressed understanding and agreed to proceed.   HPI:  Acute telemedicine visit for : 09/16/20 after family gathering tested + covid 49 other 7 family members tested + and thinks she got from 34 y/o niece had cold sxs, fatigue, cough, hoarse voice, PND and increased mucous/phelgm cough is mild and resolved and mild tried allegra qod as she has been sneezing tried coricidan for cough mucinex dm did not make her feel right like out of body experience  It took her 2.5 weeks to feel better after + covid 19 she never had fever She tried D3 5K-10K qd, zinc 50 mg, vitamin C 2000 mg, quercetin    -COVID-19 vaccine status: 3/3 moderna  ROS: See pertinent positives and negatives per HPI.  Past Medical History:  Diagnosis Date   Allergy    Bacterial vaginosis    Cervicalgia    Chicken pox    Colon polyps    COVID-19    09/16/20   Diverticulitis    Fibroid    Hyperlipidemia    03/17/17 TC 253, HDL 115 per review of notes   Hypertension    Irregular menses    Kidney stones    Mid back pain    Ovarian cyst    UTI (urinary tract infection)     Past Surgical History:  Procedure Laterality Date   2008 fibroid surgery      laparoscopy    APPENDECTOMY     BREAST SURGERY     right breast benign bx   COLONOSCOPY W/ POLYPECTOMY     2014    DILATION AND CURETTAGE OF UTERUS     x 3 2/2 miscarriages    TONSILLECTOMY     WISDOM TOOTH EXTRACTION       Current Outpatient Medications:    Cholecalciferol (VITAMIN D3 PO), Take 10,000 Units by mouth daily. , Disp:  , Rfl:    fexofenadine (ALLEGRA) 180 MG tablet, Take 180 mg by mouth at bedtime as needed for allergies or rhinitis., Disp: , Rfl:    hydrochlorothiazide (MICROZIDE) 12.5 MG capsule, TAKE 1 CAPSULE (12.5 MG TOTAL) BY MOUTH DAILY in am, Disp: 90 capsule, Rfl: 3   naproxen (NAPROSYN) 500 MG tablet, Take 1 tablet (500 mg total) by mouth 2 (two) times daily with a meal., Disp: 180 tablet, Rfl: 1   sodium chloride (OCEAN) 0.65 % SOLN nasal spray, Place 2 sprays into both nostrils as needed for congestion., Disp: 30 mL, Rfl: 2   YUVAFEM 10 MCG TABS vaginal tablet, , Disp: , Rfl: 1   ipratropium (ATROVENT) 0.06 % nasal spray, PLACE 2 SPRAYS INTO BOTH NOSTRILS 3 (THREE) TIMES DAILY AS NEEDED for post nasal drip, Disp: 15 mL, Rfl: 2   Olopatadine HCl 0.2 % SOLN, Apply 1 drop to eye daily. (Patient not taking: Reported on 10/08/2020), Disp: 1 Bottle, Rfl: 2  EXAM:  VITALS per patient if applicable:  GENERAL: alert, oriented, appears well and in no acute distress  PSYCH/NEURO: pleasant and cooperative, no obvious depression or anxiety, speech and thought processing grossly intact  ASSESSMENT AND PLAN:  Discussed the following  assessment and plan:  COVID-19 09/16/20 feeling better as of 10/04/20  PND (post-nasal drip) - Plan: sodium chloride (OCEAN) 0.65 % SOLN nasal spray, ipratropium (ATROVENT) 0.06 % nasal spray Allergic rhinitis, unspecified seasonality, unspecified trigger - Plan: sodium chloride (OCEAN) 0.65 % SOLN nasal spray, ipratropium (ATROVENT) 0.06 % nasal spray Prn allegra  Sinus sxs negative today but if not better consider azm   Hoarseness of voice If not better call back will refer to ENT   HM Flu shot utd  Tdap rec will give info at f/u  moderna 3/3 consider booster covid 19 09/16/20 + Not immune hep B Declines HIV testing, hep C negative    01/05/16 div and IH, hyperplastic with FH due 5 colonoscopy Dr. Hilarie Fredrickson  01/04/21    Never smoker   Pap had 05/19/16 neg except BV neg  HPV f/u [facility] in [place] 05/19/18, pap 12/06/19 negative    05/17/16 negative Mammogram had [facility]  -[facility] 12/06/19 negative right breast fibroglandular changes    -CTA chest 12/06/17 +left breast mass ? Cyst/cyst collection vs fibroadenoma needs b/l diagnostic mammogram with [place] (see above 11/2019 mammogram)    Reviewed vitamin B12 06/30/17 1770 high    Of note chronic back/neck pain prev referred NS but I do not believe pt ever saw as of 01/2020     Skin [place] derm 01/2020 saw Dr. Ubaldo Glassing   rec healthy diet and exercise    -we discussed possible serious and likely etiologies, options for evaluation and workup, limitations of telemedicine visit vs in person visit, treatment, treatment risks and precautions. Pt prefers to treat via telemedicine empirically rather than in person at this moment.     I discussed the assessment and treatment plan with the patient. The patient was provided an opportunity to ask questions and all were answered. The patient agreed with the plan and demonstrated an understanding of the instructions.    Time spent 10 minutes  Delorise Jackson, MD

## 2020-10-08 NOTE — Progress Notes (Signed)
Tested positive 09/16/20 states that she started feeling better this past Saturday.  Patient used nose sprays during COVID.

## 2020-12-15 DIAGNOSIS — M5412 Radiculopathy, cervical region: Secondary | ICD-10-CM | POA: Diagnosis not present

## 2020-12-15 DIAGNOSIS — M654 Radial styloid tenosynovitis [de Quervain]: Secondary | ICD-10-CM | POA: Diagnosis not present

## 2021-02-04 ENCOUNTER — Encounter: Payer: BC Managed Care – PPO | Admitting: Internal Medicine

## 2021-02-09 ENCOUNTER — Other Ambulatory Visit: Payer: Self-pay | Admitting: Internal Medicine

## 2021-02-09 DIAGNOSIS — I10 Essential (primary) hypertension: Secondary | ICD-10-CM

## 2021-02-17 ENCOUNTER — Encounter: Payer: Self-pay | Admitting: Internal Medicine

## 2021-08-13 ENCOUNTER — Encounter: Payer: Self-pay | Admitting: Internal Medicine

## 2021-08-13 ENCOUNTER — Ambulatory Visit: Payer: No Typology Code available for payment source | Admitting: Internal Medicine

## 2021-08-13 VITALS — BP 124/84 | HR 61 | Temp 98.2°F | Resp 14 | Ht 63.0 in | Wt 130.0 lb

## 2021-08-13 DIAGNOSIS — I10 Essential (primary) hypertension: Secondary | ICD-10-CM

## 2021-08-13 DIAGNOSIS — R29898 Other symptoms and signs involving the musculoskeletal system: Secondary | ICD-10-CM | POA: Insufficient documentation

## 2021-08-13 DIAGNOSIS — M549 Dorsalgia, unspecified: Secondary | ICD-10-CM | POA: Diagnosis not present

## 2021-08-13 DIAGNOSIS — Z1389 Encounter for screening for other disorder: Secondary | ICD-10-CM

## 2021-08-13 DIAGNOSIS — Z0000 Encounter for general adult medical examination without abnormal findings: Secondary | ICD-10-CM | POA: Diagnosis not present

## 2021-08-13 DIAGNOSIS — M79631 Pain in right forearm: Secondary | ICD-10-CM

## 2021-08-13 DIAGNOSIS — Z1329 Encounter for screening for other suspected endocrine disorder: Secondary | ICD-10-CM | POA: Diagnosis not present

## 2021-08-13 DIAGNOSIS — Z23 Encounter for immunization: Secondary | ICD-10-CM

## 2021-08-13 DIAGNOSIS — M79644 Pain in right finger(s): Secondary | ICD-10-CM

## 2021-08-13 DIAGNOSIS — N952 Postmenopausal atrophic vaginitis: Secondary | ICD-10-CM

## 2021-08-13 DIAGNOSIS — Z800 Family history of malignant neoplasm of digestive organs: Secondary | ICD-10-CM

## 2021-08-13 DIAGNOSIS — Z1211 Encounter for screening for malignant neoplasm of colon: Secondary | ICD-10-CM

## 2021-08-13 DIAGNOSIS — Z823 Family history of stroke: Secondary | ICD-10-CM

## 2021-08-13 DIAGNOSIS — M25521 Pain in right elbow: Secondary | ICD-10-CM | POA: Insufficient documentation

## 2021-08-13 LAB — CBC WITH DIFFERENTIAL/PLATELET
Basophils Absolute: 0.1 10*3/uL (ref 0.0–0.1)
Basophils Relative: 1 % (ref 0.0–3.0)
Eosinophils Absolute: 0.3 10*3/uL (ref 0.0–0.7)
Eosinophils Relative: 5.3 % — ABNORMAL HIGH (ref 0.0–5.0)
HCT: 41.8 % (ref 36.0–46.0)
Hemoglobin: 14.3 g/dL (ref 12.0–15.0)
Lymphocytes Relative: 25.4 % (ref 12.0–46.0)
Lymphs Abs: 1.3 10*3/uL (ref 0.7–4.0)
MCHC: 34.1 g/dL (ref 30.0–36.0)
MCV: 93.2 fl (ref 78.0–100.0)
Monocytes Absolute: 0.6 10*3/uL (ref 0.1–1.0)
Monocytes Relative: 11 % (ref 3.0–12.0)
Neutro Abs: 3 10*3/uL (ref 1.4–7.7)
Neutrophils Relative %: 57.3 % (ref 43.0–77.0)
Platelets: 298 10*3/uL (ref 150.0–400.0)
RBC: 4.48 Mil/uL (ref 3.87–5.11)
RDW: 13.2 % (ref 11.5–15.5)
WBC: 5.3 10*3/uL (ref 4.0–10.5)

## 2021-08-13 LAB — COMPREHENSIVE METABOLIC PANEL
ALT: 24 U/L (ref 0–35)
AST: 22 U/L (ref 0–37)
Albumin: 4.6 g/dL (ref 3.5–5.2)
Alkaline Phosphatase: 70 U/L (ref 39–117)
BUN: 18 mg/dL (ref 6–23)
CO2: 31 mEq/L (ref 19–32)
Calcium: 9.5 mg/dL (ref 8.4–10.5)
Chloride: 99 mEq/L (ref 96–112)
Creatinine, Ser: 0.64 mg/dL (ref 0.40–1.20)
GFR: 96.68 mL/min (ref >60.00)
Glucose, Bld: 85 mg/dL (ref 70–99)
Potassium: 4.2 mEq/L (ref 3.5–5.1)
Sodium: 137 mEq/L (ref 135–145)
Total Bilirubin: 0.9 mg/dL (ref 0.2–1.2)
Total Protein: 7 g/dL (ref 6.0–8.3)

## 2021-08-13 LAB — LIPID PANEL
Cholesterol: 255 mg/dL — ABNORMAL HIGH (ref 0–200)
HDL: 99.6 mg/dL (ref >39.00)
LDL Cholesterol: 137 mg/dL — ABNORMAL HIGH (ref 0–99)
NonHDL: 155.37
Total CHOL/HDL Ratio: 3
Triglycerides: 90 mg/dL (ref 0.0–149.0)
VLDL: 18 mg/dL (ref 0.0–40.0)

## 2021-08-13 LAB — TSH: TSH: 3.12 u[IU]/mL (ref 0.35–5.50)

## 2021-08-13 MED ORDER — NAPROXEN 500 MG PO TABS: 500.0000 mg | ORAL_TABLET | Freq: Two times a day (BID) | ORAL | 1 refills | Status: DC

## 2021-08-13 MED ORDER — HYDROCHLOROTHIAZIDE 12.5 MG PO CAPS: ORAL_CAPSULE | ORAL | 3 refills | Status: DC

## 2021-08-13 MED ORDER — YUVAFEM 10 MCG VA TABS: 1.0000 | ORAL_TABLET | VAGINAL | 2 refills | Status: DC

## 2021-08-13 MED ORDER — TETANUS-DIPHTH-ACELL PERTUSSIS 5-2.5-18.5 LF-MCG/0.5 IM SUSP: 0.5000 mL | Freq: Once | INTRAMUSCULAR | 0 refills | Status: AC

## 2021-08-13 NOTE — Progress Notes (Signed)
Chief Complaint  Patient presents with   Follow-up    Disc health issues. 2nd brother had stroke disc about risk for pt. Disc colonoscopy was called to get sch but wants to clarify she is due for one. Disc low energy lvls.    Annual Exam   Annual  1. Htn controlled on hctz 12.5 mg wants refills  2. Right thumb, reduced grip b/l hands R>l and she is right handed, right hand and left hand and wrists pain and right forearm and elbow pain and chronic low back pain wants referral to ortho  3. Fatigue  4. Due colonoscopy  5. FH stroke m gf and m gm and ? How mom died natural causes and 1 of her brothers age 66 y.o Scott had a ICH/stroke and 2 brain aneurysms due to vein issues per the bpt and other brother had a stroke    Review of Systems  Constitutional:  Positive for malaise/fatigue. Negative for weight loss.  HENT:  Negative for hearing loss.   Eyes:  Negative for blurred vision.  Respiratory:  Negative for shortness of breath.   Cardiovascular:  Negative for chest pain.  Gastrointestinal:  Negative for abdominal pain and blood in stool.  Genitourinary:  Negative for dysuria.  Musculoskeletal:  Negative for falls and joint pain.  Skin:  Negative for rash.  Neurological:  Negative for headaches.  Psychiatric/Behavioral:  Negative for depression. The patient has insomnia.        Takes melatonin  Past Medical History:  Diagnosis Date   Allergy    Bacterial vaginosis    Cervicalgia    Chicken pox    Colon polyps    COVID-19    09/16/20   Diverticulitis    Fibroid    Hyperlipidemia    03/17/17 TC 253, HDL 115 per review of notes   Hypertension    Irregular menses    Kidney stones    Mid back pain    Ovarian cyst    UTI (urinary tract infection)    Past Surgical History:  Procedure Laterality Date   2008 fibroid surgery      laparoscopy    APPENDECTOMY     BREAST SURGERY     right breast benign bx   COLONOSCOPY W/ POLYPECTOMY     2014    DILATION AND CURETTAGE OF UTERUS      x 3 2/2 miscarriages    TONSILLECTOMY     WISDOM TOOTH EXTRACTION     Family History  Problem Relation Age of Onset   Hypertension Mother    Dementia Mother        60/60   Alcohol abuse Father    Hypertension Father    Colon cancer Father        60   Stroke Brother    Stroke Brother    Stroke Maternal Grandmother    Hypertension Maternal Grandmother    Hypertension Maternal Grandfather    Hypertension Paternal Grandmother    Stroke Paternal Grandfather    Hypertension Paternal Grandfather    Dementia Maternal Aunt    Cancer - Ovarian Maternal Aunt    Colon cancer Other        23-70   Social History   Socioeconomic History   Marital status: Divorced    Spouse name: Not on file   Number of children: 0   Years of education: 16   Highest education level: Not on file  Occupational History   Occupation: [employer]  Use   Smoking status: Never   Smokeless tobacco: Never  Substance and Sexual Activity   Alcohol use: Yes    Alcohol/week: 0.0 standard drinks    Comment: twice weekly   Drug use: No   Sexual activity: Yes    Birth control/protection: None  Other Topics Concern   Not on file  Social History Narrative   Fun: Relax, swim, exercise    Denies religious beliefs effecting health care.   Denies abuse and feels safe at home.    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    fexofenadine (ALLEGRA) 180 MG tablet Take 180 mg by mouth at bedtime as needed for allergies or rhinitis.   Tdap (BOOSTRIX)  5-2.5-18.5 LF-MCG/0.5 injection Inject 0.5 mLs into the muscle once for 1 dose.   [DISCONTINUED] hydrochlorothiazide (MICROZIDE) 12.5 MG capsule TAKE 1 CAPSULE BY MOUTH EVERY DAY IN THE MORNING   [DISCONTINUED] naproxen (NAPROSYN) 500 MG tablet Take 1 tablet (500 mg total) by mouth 2 (two) times daily with a meal.   [DISCONTINUED] YUVAFEM 10 MCG TABS vaginal tablet    Allergies  Allergen Reactions   Ibuprofen Other (See Comments)   Penicillins     Fever/hives, happened as a child    No results found for this or any previous visit (from the past 2160 hour(s)). Objective  Body mass index is 23.03 kg/m. Wt Readings from Last 3 Encounters:  08/13/21 130 lb (59 kg)  10/08/20 129 lb (58.5 kg)  02/05/20 122 lb 3.2 oz (55.4 kg)   Temp Readings from Last 3 Encounters:  08/13/21 98.2 F (36.8 C) (Oral)  02/05/20 98.2 F (36.8 C) (Oral)  12/06/17 98.2 F (36.8 C) (Oral)   BP Readings from Last 3 Encounters:  08/13/21 124/84  02/05/20 130/80  08/22/18 (!) 154/105   Pulse Readings from Last 3 Encounters:  08/13/21 61  02/05/20 82  08/22/18 76    Physical Exam Vitals and nursing note reviewed.  Constitutional:      Appearance: Normal appearance. She is well-developed and well-groomed.  HENT:     Head: Normocephalic and atraumatic.  Eyes:     Conjunctiva/sclera: Conjunctivae normal.     Pupils: Pupils are equal, round, and reactive to light.  Cardiovascular:     Rate and Rhythm: Normal rate and regular rhythm.     Heart sounds: Normal heart sounds. No murmur heard. Pulmonary:     Effort: Pulmonary effort is normal.     Breath sounds: Normal breath sounds.  Abdominal:     General: Abdomen is flat. Bowel sounds are normal.     Tenderness: There is no abdominal tenderness.  Musculoskeletal:     Right elbow: Tenderness present.  Skin:    General: Skin is warm and dry.  Neurological:     General: No focal deficit present.     Mental Status: She is alert and oriented to  person, place, and time. Mental status is at baseline.  Cranial Nerves: Cranial nerves 2-12 are intact.     Motor: Motor function is intact.     Coordination: Coordination is intact.     Gait: Gait is intact.  Psychiatric:        Attention and Perception: Attention and perception normal.        Mood and Affect: Mood and affect normal.        Speech: Speech normal.        Behavior: Behavior normal. Behavior is cooperative.        Thought Content: Thought content normal.        Cognition and Memory: Cognition and memory normal.        Judgment: Judgment normal.    Assessment  Plan  Annual physical exam See below   Back pain, unspecified back location, unspecified back pain laterality, unspecified chronicity - Plan: naproxen (NAPROSYN) 500 MG tablet  Mid back pain - Plan: naproxen (NAPROSYN) 500 MG tablet  Hypertension, unspecified type - Plan: Comprehensive metabolic panel, Lipid panel, CBC with Differential/Platelet Controlled on hctz 12.5 mg qd   Vaginal atrophy - Plan: YUVAFEM 10 MCG TABS vaginal tablet [facility] in [place]     Decreased grip strength of left hand - Plan: Ambulatory referral to Orthopedic Surgery  Decreased grip strength of right hand - Plan: Ambulatory referral to Orthopedic Surgery  Right elbow pain - Plan: Ambulatory referral to Orthopedic Surgery Disc counter force brace  Pain of right thumb - Plan: Ambulatory referral to Orthopedic Surgery  Pain of right forearm - Plan: Ambulatory referral to Orthopedic Surgery  Screening for colon cancer - Plan: Ambulatory referral to Gastroenterology  FH: colon cancer - Plan: Ambulatory referral to Gastroenterology  FH: stroke  HM Flu shot utd  Tdap rx today moderna 3/3 consider booster covid 19 09/16/20 + declines Not immune hep B Declines HIV testing, hep C negative    01/05/16 div and IH, hyperplastic with FH due 5 colonoscopy Dr. Hilarie Fredrickson  01/04/21    Never smoker   Pap had 05/19/16 neg  except BV neg HPV f/u [facility] [place] in [place] 05/19/18, pap 12/06/19 negative    05/17/16 negative Mammogram had [facility]  -[facility] 12/06/19 negative right breast fibroglandular changes    -CTA chest 12/06/17 +left breast mass ? Cyst/cyst collection vs fibroadenoma needs b/l diagnostic mammogram with [place] (see above 11/2019 mammogram)    Reviewed vitamin B12 06/30/17 1770 high    Of note chronic back/neck pain prev referred NS but I do not believe pt ever saw as of 01/2020   Skin [place] derm 01/2020 saw Dr. Ubaldo Glassing   rec healthy diet and exercise   Provider: Dr. Olivia Mackie McLean-Scocuzza-Internal Medicine

## 2021-08-13 NOTE — Patient Instructions (Addendum)
Voltaren gel 4x per day as needed hand and elbow  Aspercream with lidocaine   Enteric coated aspirin  Call and schedule ob/gyn and make sure they do and fax me copy of mammogram please [phone]   Dr. Apolonio Schneiders IV or Dr. Amedeo Plenty ortho will call you   Phone Fax E-mail Address  [phone] Not available [address]   [address]   [place] [address]   MD No Physician   Dr. Hilarie Fredrickson call them please Phone Fax E-mail Address  [phone] Ulice [email or web address] [address] [address]     Specialties     Gastroenterology              [facility] Ask your health care provider which exercises are safe for you. Do exercises exactly as told by your health care provider and adjust them as directed. It is normal to feel mild stretching, pulling, tightness, or discomfort as you do these exercises. Stop right away if you feel sudden pain or your pain gets worse. Do not begin these exercises until told by your health care provider. Stretching and range-of-motion exercises These exercises warm up your muscles and joints and improve the movement and flexibility of your elbow. Wrist flexion, assisted  Straighten your left / right elbow in front of you with your palm facing down toward the floor. If told by your health care provider, bend your left / right elbow to a 90-degree angle (right angle) at your side instead of holding it straight. With your other hand, gently push over the back of your left / right hand so your fingers point toward the floor (flexion). Stop when you feel a gentle stretch on the back of your forearm. Hold this position for __________ seconds. Repeat __________ times. Complete this exercise __________ times a day. Wrist extension, assisted  Straighten your left / right elbow in front of you with your palm facing up toward the ceiling. If told by your health care provider, bend your left / right elbow to a  90-degree angle (right angle) at your side instead of holding it straight. With your other hand, gently pull your left / right hand and fingers toward the floor (extension). Stop when you feel a gentle stretch on the palm side of your forearm. Hold this position for __________ seconds. Repeat __________ times. Complete this exercise __________ times a day. Assisted forearm rotation, supination Sit or stand with your elbows at your side. Bend your left / right elbow to a 90-degree angle (right angle). Using your uninjured hand, turn your left / right palm up toward the ceiling (supination) until you feel a gentle stretch along the inside of your forearm. Hold this position for __________ seconds. Repeat __________ times. Complete this exercise __________ times a day. Assisted forearm rotation, pronation Sit or stand with your elbows at your side. Bend your left / right elbow to a 90-degree angle (right angle). Using your uninjured hand, turn your left / right palm down toward the floor (pronation) until you feel a gentle stretch along the outside of your forearm. Hold this position for __________ seconds. Repeat __________ times. Complete this exercise __________ times a day. Strengthening exercises These exercises build strength and endurance in your forearm and elbow. Endurance is the ability to use your muscles for a long time, even after they get tired. Radial deviation  Stand with a __________ weight or a hammer in your left / right hand. Or, sit while  holding a rubber exercise band or tubing, with your left / right forearm supported on a table or countertop. Position your forearm so that the thumb is facing the ceiling, as if you are going to clap your hands. This is the neutral position. Raise your hand upward in front of you so your thumb moves toward the ceiling (radial deviation), or pull up on the rubber tubing. Keep your forearm and elbow still while you move your wrist only. Hold  this position for __________ seconds. Slowly return to the starting position. Repeat __________ times. Complete this exercise __________ times a day. Wrist extension, eccentric Sit with your left / right forearm palm-down and supported on a table or other surface. Let your left / right wrist extend over the edge of the surface. Hold a __________ weight or a piece of exercise band or tubing in your left / right hand. If using a rubber exercise band or tubing, hold the other end of the tubing with your other hand. Use your uninjured hand to move your left / right hand up toward the ceiling. Take your uninjured hand away and slowly return to the starting position using only your left / right hand. Lowering your arm under tension is called eccentric extension. Repeat __________ times. Complete this exercise __________ times a day. Wrist extension Do not do this exercise if it causes pain at the outside of your elbow. Only do this exercise once instructed by your health care provider. Sit with your left / right forearm supported on a table or other surface and your palm turned down toward the floor. Let your left / right wrist extend over the edge of the surface. Hold a __________ weight or a piece of rubber exercise band or tubing. If you are using a rubber exercise band or tubing, hold the band or tubing in place with your other hand to provide resistance. Slowly bend your wrist so your hand moves up toward the ceiling (extension). Move only your wrist, keeping your forearm and elbow still. Hold this position for __________ seconds. Slowly return to the starting position. Repeat __________ times. Complete this exercise __________ times a day. Forearm rotation, supination To do this exercise, you will need a lightweight hammer or rubber mallet. Sit with your left / right forearm supported on a table or other surface. Bend your elbow to a 90-degree angle (right angle). Position your forearm so that  your palm is facing down toward the floor, with your hand resting over the edge of the table. Hold a hammer in your left / right hand. To make this exercise easier, hold the hammer near the head of the hammer. To make this exercise harder, hold the hammer near the end of the handle. Without moving your wrist or elbow, slowly rotate your forearm so your palm faces up toward the ceiling (supination). Hold this position for __________ seconds. Slowly return to the starting position. Repeat __________ times. Complete this exercise __________ times a day. Shoulder blade squeeze Sit in a stable chair or stand with good posture. If you are sitting down, do not let your back touch the back of the chair. Your arms should be at your sides with your elbows bent to a 90-degree angle (right angle). Position your forearms so that your thumbs are facing the ceiling (neutral position). Without lifting your shoulders up, squeeze your shoulder blades tightly together. Hold this position for __________ seconds. Slowly release and return to the starting position. Repeat __________ times. Complete this  exercise __________ times a day. This information is not intended to replace advice given to you by your health care provider. Make sure you discuss any questions you have with your health care provider. Document Revised: 05/30/2019 Document Reviewed: 05/30/2019 Elsevier Patient Education  [place] Ask your health care provider which exercises are safe for you. Do exercises exactly as told by your health care provider and adjust them as directed. It is normal to feel mild stretching, pulling, tightness, or discomfort as you do these exercises. Stop right away if you feel sudden pain or your pain gets worse. Do not begin these exercises until told by your health care provider. Stretching and range-of-motion exercises These exercises warm up your muscles and joints and improve the  movement and flexibility of your elbow. Wrist extension, assisted  Straighten your left / right elbow in front of you with your palm facing up toward the ceiling. If told by your health care provider, bend your left / right elbow to a 90-degree angle (right angle) at your side instead of holding it straight. With your other hand, gently pull your left / right hand and fingers toward the floor (extension). Stop when you feel a gentle stretch on the palm side of your forearm. Hold this position for __________ seconds. Repeat __________ times. Complete this exercise __________ times a day. Wrist flexion, assisted  Straighten your left / right elbow in front of you with your palm facing down toward the floor. If told by your health care provider, bend your left / right elbow to a 90-degree angle (right angle) at your side instead of holding it straight. With your other hand, gently push over the back of your left / right hand so your fingers point toward the floor (flexion). Stop when you feel a gentle stretch on the back of your forearm. Hold this position for __________ seconds. Repeat __________ times. Complete this exercise __________ times a day. Assisted forearm rotation, supination Sit or stand with your elbows at your side. Bend your left / right elbow to a 90-degree angle (right angle). Using your uninjured hand, turn your left / right palm up toward the ceiling (supination) until you feel a gentle stretch along the inside of your forearm. Hold this position for __________ seconds. Repeat __________ times. Complete this exercise __________ times a day. Assisted forearm rotation, pronation Sit or stand with your elbows at your side. Bend your left / right elbow to a 90-degree angle (right angle). Using your uninjured hand, turn your left / right palm down toward the floor (pronation) until you feel a gentle stretch along the top of your forearm. Hold this position for __________  seconds. Repeat __________ times. Complete this exercise __________ times a day. Strengthening exercises These exercises build strength and endurance in your elbow. Endurance is the ability to use your muscles for a long time, even after they get tired. Wrist flexion  Sit with your left / right forearm supported on a table or other surface and your palm turned up toward the ceiling. Let your left / right wrist extend over the edge of the surface. Hold a __________ weight or a piece of rubber exercise band or tubing. If using a rubber exercise band or tubing, hold the other end of the tubing with your other hand. Slowly bend your wrist so your hand moves up toward the ceiling (flexion). Try to only move your wrist and keep the rest of your arm still. Hold  this position for __________ seconds. Slowly return to the starting position. Repeat __________ times. Complete this exercise __________ times a day. Wrist flexion, eccentric Sit with your left / right forearm palm-up and supported on a table or other surface. Let your left / right wrist extend over the edge of the surface. Hold a __________ weight or a piece of rubber exercise band or tubing in your left / right hand. If using a rubber exercise band or tubing, hold the other end of the tubing with your other hand. Use your uninjured hand to move your left / right hand up toward the ceiling. Take your uninjured hand away and slowly return to the starting position using only your left / right hand (eccentric flexion). Repeat __________ times. Complete this exercise __________ times a day. Forearm rotation, pronation To do this exercise, you will need a lightweight hammer or rubber mallet. Sit with your left / right forearm supported on a table or other surface. Bend your elbow to a 90-degree angle (right angle). Position your forearm so that your palm is facing up toward the ceiling, with your hand resting over the edge of the table. Hold a  hammer in your left / right hand. To make this exercise easier, hold the hammer near the head of the hammer. To make this exercise harder, hold the hammer near the end of the handle. Without moving your elbow, slowly turn (rotate) your forearm so your palm faces down toward the floor (pronation). Hold this position for __________ seconds. Slowly return to the starting position. Repeat __________ times. Complete this exercise __________ times a day. Shoulder blade squeeze Sit in a stable chair or stand with good posture. If you are sitting down, do not let your back touch the back of the chair. Your arms should be at your sides with your elbows bent to a 90-degree angle (right angle). Position your forearms so that your thumbs are facing the ceiling (neutral position). Without lifting your shoulders up, squeeze your shoulder blades tightly together. Hold this position for __________ seconds. Slowly release and return to the starting position. Repeat __________ times. Complete this exercise __________ times a day. This information is not intended to replace advice given to you by your health care provider. Make sure you discuss any questions you have with your health care provider. Document Revised: 05/30/2019 Document Reviewed: 05/30/2019 Elsevier Patient Education  [place] Syndrome  Carpal tunnel syndrome is a condition that causes pain, numbness, and weakness in your hand and fingers. The carpal tunnel is a narrow area located on the palm side of your wrist. Repeated wrist motion or certain diseases may cause swelling within the tunnel. This swelling pinches the main nerve in the wrist. The main nerve in the wrist is called the median nerve. What are the causes? This condition may be caused by: Repeated and forceful wrist and hand motions. Wrist injuries. Arthritis. A cyst or tumor in the carpal tunnel. Fluid buildup during pregnancy. Use of tools that  vibrate. Sometimes the cause of this condition is not known. What increases the risk? The following factors may make you more likely to develop this condition: Having a job that requires you to repeatedly or forcefully move your wrist or hand or requires you to use tools that vibrate. This may include jobs that involve using computers, working on an [employer], or working with [place] such as Pension scheme manager. Being a woman. Having certain conditions, such as: Diabetes.  Obesity. An underactive thyroid (hypothyroidism). Kidney failure. Rheumatoid arthritis. What are the signs or symptoms? Symptoms of this condition include: A tingling feeling in your fingers, especially in your thumb, index, and middle fingers. Tingling or numbness in your hand. An aching feeling in your entire arm, especially when your wrist and elbow are bent for a long time. Wrist pain that goes up your arm to your shoulder. Pain that goes down into your palm or fingers. A weak feeling in your hands. You may have trouble grabbing and holding items. Your symptoms may feel worse during the night. How is this diagnosed? This condition is diagnosed with a medical history and physical exam. You may also have tests, including: Electromyogram (EMG). This test measures electrical signals sent by your nerves into the muscles. Nerve conduction study. This test measures how well electrical signals pass through your nerves. Imaging tests, such as X-rays, ultrasound, and MRI. These tests check for possible causes of your condition. How is this treated? This condition may be treated with: Lifestyle changes. It is important to stop or change the activity that caused your condition. Doing exercise and activities to strengthen and stretch your muscles and tendons (physical therapy). Making lifestyle changes to help with your condition and learning how to do your daily activities safely (occupational therapy). Medicines for  pain and inflammation. This may include medicine that is injected into your wrist. A wrist splint or brace. Surgery. Follow these instructions at home: If you have a splint or brace: Wear the splint or brace as told by your health care provider. Remove it only as told by your health care provider. Loosen the splint or brace if your fingers tingle, become numb, or turn cold and blue. Keep the splint or brace clean. If the splint or brace is not waterproof: Do not let it get wet. Cover it with a watertight covering when you take a bath or shower. Managing pain, stiffness, and swelling If directed, put ice on the painful area. To do this: If you have a removeable splint or brace, remove it as told by your health care provider. Put ice in a plastic bag. Place a towel between your skin and the bag or between the splint or brace and the bag. Leave the ice on for 20 minutes, 2-3 times a day. Do not fall asleep with the cold pack on your skin. Remove the ice if your skin turns bright red. This is very important. If you cannot feel pain, heat, or cold, you have a greater risk of damage to the area. Move your fingers often to reduce stiffness and swelling. General instructions Take over-the-counter and prescription medicines only as told by your health care provider. Rest your wrist and hand from any activity that may be causing your pain. If your condition is work related, talk with your employer about changes that can be made, such as getting a wrist pad to use while typing. Do any exercises as told by your health care provider, physical therapist, or occupational therapist. Keep all follow-up visits. This is important. Contact a health care provider if: You have new symptoms. Your pain is not controlled with medicines. Your symptoms get worse. Get help right away if: You have severe numbness or tingling in your wrist or hand. Summary Carpal tunnel syndrome is a condition that causes pain,  numbness, and weakness in your hand and fingers. It is usually caused by repeated wrist motions. Lifestyle changes and medicines are used to treat carpal tunnel syndrome.  Surgery may be recommended. Follow your health care provider's instructions about wearing a splint, resting from activity, keeping follow-up visits, and calling for help. This information is not intended to replace advice given to you by your health care provider. Make sure you discuss any questions you have with your health care provider. Document Revised: 07/19/2019 Document Reviewed: 07/19/2019 Elsevier Patient Education  [place].

## 2021-08-14 ENCOUNTER — Other Ambulatory Visit: Payer: Self-pay | Admitting: Internal Medicine

## 2021-08-14 DIAGNOSIS — E785 Hyperlipidemia, unspecified: Secondary | ICD-10-CM

## 2021-08-14 LAB — URINALYSIS, ROUTINE W REFLEX MICROSCOPIC
Bilirubin Urine: NEGATIVE
Glucose, UA: NEGATIVE
Hgb urine dipstick: NEGATIVE
Ketones, ur: NEGATIVE
Leukocytes,Ua: NEGATIVE
Nitrite: NEGATIVE
Protein, ur: NEGATIVE
Specific Gravity, Urine: 1.011 (ref 1.001–1.035)
pH: 7 (ref 5.0–8.0)

## 2021-08-14 MED ORDER — PRAVASTATIN SODIUM 20 MG PO TABS: 20.0000 mg | ORAL_TABLET | Freq: Every day | ORAL | 3 refills | Status: DC

## 2021-10-12 ENCOUNTER — Encounter: Payer: Self-pay | Admitting: Internal Medicine

## 2021-10-13 ENCOUNTER — Ambulatory Visit: Payer: No Typology Code available for payment source | Admitting: Internal Medicine

## 2021-10-15 ENCOUNTER — Ambulatory Visit: Payer: No Typology Code available for payment source | Admitting: Internal Medicine

## 2021-10-16 ENCOUNTER — Encounter: Payer: Self-pay | Admitting: Internal Medicine

## 2021-10-16 ENCOUNTER — Ambulatory Visit (INDEPENDENT_AMBULATORY_CARE_PROVIDER_SITE_OTHER): Payer: No Typology Code available for payment source

## 2021-10-16 ENCOUNTER — Ambulatory Visit: Payer: No Typology Code available for payment source | Admitting: Internal Medicine

## 2021-10-16 VITALS — BP 122/62 | HR 80 | Temp 98.1°F | Ht 63.0 in | Wt 128.6 lb

## 2021-10-16 DIAGNOSIS — S63630A Sprain of interphalangeal joint of right index finger, initial encounter: Secondary | ICD-10-CM

## 2021-10-16 DIAGNOSIS — M79644 Pain in right finger(s): Secondary | ICD-10-CM

## 2021-10-16 DIAGNOSIS — L309 Dermatitis, unspecified: Secondary | ICD-10-CM | POA: Diagnosis not present

## 2021-10-16 MED ORDER — FLUOCINONIDE 0.05 % EX CREA: 1.0000 | TOPICAL_CREAM | Freq: Two times a day (BID) | CUTANEOUS | 2 refills | Status: AC

## 2021-10-16 NOTE — Patient Instructions (Addendum)
[facility] 4.8 (12)  Medical supply store [address] Closes soon ? 4?PM  [phone]  2. [place] Open ? Closes 5?PM  [phone] [facility] 4.4 (16)  Medical supply store [place], [place] Open ? Closes 5?PM  [phone] In-store shopping  3. Senior medical supply  [facility] [phone])  Medical clinic [place] Open ? Closes 5?PM  [phone]  Rest  Ice*  Elevation  Finger splint  F/u ortho call [facility] Tylenol  Aspercream with lidocaine Voltaren  Finger Sprain, Adult A finger sprain is a tear or stretch in a ligament in a finger. Ligaments are tissues that connect bones to each other. What are the causes? Finger sprains happen when something makes the bones in the hand move in an abnormal way. They are often caused by a fall or an accident. What increases the risk? This condition is more likely to develop in people who: Participate in sports in which it is easy to fall, such as skiing. Play sports that involve catching an object, such as basketball. Have poor strength and flexibility. What are the signs or symptoms? Symptoms of this condition include: Pain or tenderness in the finger. Swelling in the finger. A bluish appearance to the finger. Bruising. Difficulty bending and flexing the finger. How is this diagnosed? This condition is diagnosed with an exam of your finger. Your health care provider may take an X-ray to see if any bones are broken or dislocated. How is this treated? Treatment for this condition depends on how severe the sprain is. It may involve: Preventing the finger from moving for a period of time. Your finger may be wrapped in a bandage (dressing) or splint, or your finger may be taped to the fingers beside it (buddy taping). Medicines for pain. Exercises to strengthen the finger. These may be recommended when the finger  has healed. Surgery to reconnect the ligament to a bone. This may be done if the ligament was completely torn. Follow these instructions at home: If you have a removable splint: Wear the splint as told by your health care provider. Remove it only as told by your health care provider. Check the skin around the splint every day. Tell your health care provider about any concerns. Loosen the splint if your fingers tingle, become numb, or turn cold and blue. Keep the splint clean. If the splint is not waterproof: Do not let it get wet. Cover it with a watertight covering when you take a bath or shower. Managing pain, stiffness, and swelling If directed, put ice on the injured area. To do this: If you have a removable splint, remove it as told by your health care provider. Put ice in a plastic bag. Place a towel between your skin and the bag. Leave the ice on for 20 minutes, 2-3 times a day. Remove the ice if your skin turns bright red. This is very important. If you cannot feel pain, heat, or cold, you have a greater risk of damage to the area. Move your fingers often to reduce stiffness and swelling. Raise (elevate) the injured area above the level of your heart while you are sitting or lying down. Medicines Take over-the-counter and prescription medicines only as told by your health care provider. Ask your health care provider if the medicine prescribed to you requires you to avoid driving or using machinery. General instructions Keep  any dressings dry until your health care provider says they can be removed. If your fingers are buddy taped, replace your buddy taping as told by your health care provider. Do exercises as told by your health care provider or physical therapist. Do not wear rings on your injured finger. Keep all follow-up visits. This is important. Contact a health care provider if: Your pain is not controlled with medicine. Your bruising or swelling gets worse. Your splint  is damaged. You develop a fever. Get help right away if: Your finger is numb or blue. Your finger feels colder to the touch than normal. Summary A finger sprain is a tear or stretch in a ligament in a finger. Ligaments are tissues that connect bones to each other. Finger sprains happen when something makes the bones in the hand move in an abnormal way. They are often caused by a fall or accident. This condition is diagnosed with an exam of your finger. Your health care provider may do an X-ray to see if any bones are broken or dislocated. Treatment for this condition depends on how severe the sprain is. Treatment may involve buddy taping or wearing a splint. Surgery to reconnect the ligament to a bone may be needed if the ligament was torn all the way. This information is not intended to replace advice given to you by your health care provider. Make sure you discuss any questions you have with your health care provider. Document Revised: 01/30/2020 Document Reviewed: 01/30/2020 Elsevier Patient Education  [place] Exercises Hand exercises can be helpful for almost anyone. These exercises can strengthen the hands, improve flexibility and movement, and increase blood flow to the hands. These results can make work and daily tasks easier. Hand exercises can be especially helpful for people who have joint pain from arthritis or have nerve damage from overuse (carpal tunnel syndrome). These exercises can also help people who have injured a hand. Exercises Most of these hand exercises are gentle stretching and motion exercises. It is usually safe to do them often throughout the day. Warming up your hands before exercise may help to reduce stiffness. You can do this with gentle massage or by placing your hands in warm water for 10-15 minutes. It is normal to feel some stretching, pulling, tightness, or mild discomfort as you begin new exercises. This will gradually improve. Stop an  exercise right away if you feel sudden, severe pain or your pain gets worse. Ask your health care provider which exercises are best for you. Knuckle bend or "claw" fist  Stand or sit with your arm, hand, and all five fingers pointed straight up. Make sure to keep your wrist straight during the exercise. Gently bend your fingers down toward your palm until the tips of your fingers are touching the top of your palm. Keep your big knuckle straight and just bend the small knuckles in your fingers. Hold this position for __________ seconds. Straighten (extend) your fingers back to the starting position. Repeat this exercise 5-10 times with each hand. Full finger fist  Stand or sit with your arm, hand, and all five fingers pointed straight up. Make sure to keep your wrist straight during the exercise. Gently bend your fingers into your palm until the tips of your fingers are touching the middle of your palm. Hold this position for __________ seconds. Extend your fingers back to the starting position, stretching every joint fully. Repeat this exercise 5-10 times with each hand. Straight fist Stand  or sit with your arm, hand, and all five fingers pointed straight up. Make sure to keep your wrist straight during the exercise. Gently bend your fingers at the big knuckle, where your fingers meet your hand, and the middle knuckle. Keep the knuckle at the tips of your fingers straight and try to touch the bottom of your palm. Hold this position for __________ seconds. Extend your fingers back to the starting position, stretching every joint fully. Repeat this exercise 5-10 times with each hand. Tabletop  Stand or sit with your arm, hand, and all five fingers pointed straight up. Make sure to keep your wrist straight during the exercise. Gently bend your fingers at the big knuckle, where your fingers meet your hand, as far down as you can while keeping the small knuckles in your fingers straight. Think of  forming a tabletop with your fingers. Hold this position for __________ seconds. Extend your fingers back to the starting position, stretching every joint fully. Repeat this exercise 5-10 times with each hand. Finger spread  Place your hand flat on a table with your palm facing down. Make sure your wrist stays straight as you do this exercise. Spread your fingers and thumb apart from each other as far as you can until you feel a gentle stretch. Hold this position for __________ seconds. Bring your fingers and thumb tight together again. Hold this position for __________ seconds. Repeat this exercise 5-10 times with each hand. Making circles  Stand or sit with your arm, hand, and all five fingers pointed straight up. Make sure to keep your wrist straight during the exercise. Make a circle by touching the tip of your thumb to the tip of your index finger. Hold for __________ seconds. Then open your hand wide. Repeat this motion with your thumb and each finger on your hand. Repeat this exercise 5-10 times with each hand. Thumb motion  Sit with your forearm resting on a table and your wrist straight. Your thumb should be facing up toward the ceiling. Keep your fingers relaxed as you move your thumb. Lift your thumb up as high as you can toward the ceiling. Hold for __________ seconds. Bend your thumb across your palm as far as you can, reaching the tip of your thumb for the small finger (pinkie) side of your palm. Hold for __________ seconds. Repeat this exercise 5-10 times with each hand. Grip strengthening  Hold a stress ball or other soft ball in the middle of your hand. Slowly increase the pressure, squeezing the ball as much as you can without causing pain. Think of bringing the tips of your fingers into the middle of your palm. All of your finger joints should bend when doing this exercise. Hold your squeeze for __________ seconds, then relax. Repeat this exercise 5-10 times with each  hand. Contact a health care provider if: Your hand pain or discomfort gets much worse when you do an exercise. Your hand pain or discomfort does not improve within 2 hours after you exercise. If you have any of these problems, stop doing these exercises right away. Do not do them again unless your health care provider says that you can. Get help right away if: You develop sudden, severe hand pain or swelling. If this happens, stop doing these exercises right away. Do not do them again unless your health care provider says that you can. This information is not intended to replace advice given to you by your health care provider. Make sure you discuss  any questions you have with your health care provider. Document Revised: 06/26/2020 Document Reviewed: 06/26/2020 Elsevier Patient Education  [place].

## 2021-10-16 NOTE — Progress Notes (Signed)
Chief Complaint  Patient presents with   Hand Pain    Pt's right index finger is a little swollen and a tad bit blue was dark blue at the time of injury on the side.   F/u right index finger injury a few days ago when walking the dog and inner finger index was swollen purple/blue and painful mild to moderate pain. This happened 10/04/21 and pain worse with squeezing or twisting of turning to open objects nothing tried     Review of Systems  Constitutional:  Negative for weight loss.  HENT:  Negative for hearing loss.   Eyes:  Negative for blurred vision.  Respiratory:  Negative for shortness of breath.   Cardiovascular:  Negative for chest pain.  Gastrointestinal:  Negative for abdominal pain and blood in stool.  Genitourinary:  Negative for dysuria.  Musculoskeletal:  Positive for joint pain. Negative for falls.  Skin:  Negative for rash.  Neurological:  Negative for headaches.  Psychiatric/Behavioral:  Negative for depression.    Past Medical History:  Diagnosis Date   Allergy    Bacterial vaginosis    Cervicalgia    Chicken pox    Colon polyps    COVID-19    09/16/20   Diverticulitis    Fibroid    Hyperlipidemia    03/17/17 TC 253, HDL 115 per review of notes   Hypertension    Irregular menses    Kidney stones    Mid back pain    Ovarian cyst    UTI (urinary tract infection)    Past Surgical History:  Procedure Laterality Date   2008 fibroid surgery      laparoscopy    APPENDECTOMY     BREAST SURGERY     right breast benign bx   COLONOSCOPY W/ POLYPECTOMY     2014    DILATION AND CURETTAGE OF UTERUS     x 3 2/2 miscarriages    TONSILLECTOMY     WISDOM TOOTH EXTRACTION     Family History  Problem Relation Age of Onset   Hypertension Mother    Dementia Mother        61/62   Alcohol abuse Father    Hypertension Father    Colon cancer Father        39   Stroke Brother    Stroke Brother    Stroke Maternal Grandmother    Hypertension Maternal Grandmother     Hypertension Maternal Grandfather    Hypertension Paternal Grandmother    Stroke Paternal Grandfather    Hypertension Paternal Grandfather    Dementia Maternal Aunt    Cancer - Ovarian Maternal Aunt    Colon cancer Other        32-70   Social History   Socioeconomic History   Marital status: Divorced    Spouse name: Not on file   Number of children: 0   Years of education: 16   Highest education level: Not on file  Occupational History   Occupation: Set designer  Tobacco Use   Smoking status: Never   Smokeless tobacco: Never  Substance and Sexual Activity   Alcohol use: Yes    Alcohol/week: 0.0 standard drinks of alcohol    Comment: twice weekly   Drug use: No   Sexual activity: Yes    Birth control/protection: None  Other Topics Concern   Not on file  Social History Narrative   Fun: Relax, swim, exercise    Denies religious beliefs effecting health care.  Denies abuse and feels safe at home.    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    fexofenadine (ALLEGRA) 180 MG tablet Take 180 mg by mouth at bedtime as needed for allergies or rhinitis.   fluocinonide cream (LIDEX) 7.37 % Apply 1 Application topically 2 (two) times daily. Poison ivy, bug bites not for face/groin/underarms   hydrochlorothiazide (MICROZIDE) 12.5 MG capsule TAKE 1 CAPSULE BY MOUTH EVERY DAY IN THE MORNING   ipratropium (ATROVENT) 0.06 % nasal spray PLACE 2 SPRAYS INTO BOTH NOSTRILS 3 (THREE) TIMES DAILY AS NEEDED for post nasal drip    naproxen (NAPROSYN) 500 MG tablet Take 1 tablet (500 mg total) by mouth 2 (two) times daily with a meal.   Olopatadine HCl 0.2 % SOLN Apply 1 drop to eye daily.   pravastatin (PRAVACHOL) 20 MG tablet Take 1 tablet (20 mg total) by mouth daily. After 6 pm   sodium chloride (OCEAN) 0.65 % SOLN nasal spray Place 2 sprays into both nostrils as needed for congestion.   YUVAFEM 10 MCG TABS vaginal tablet Place 1 tablet (10 mcg total) vaginally once a week.   Allergies  Allergen Reactions   Ibuprofen Other (See Comments)   Penicillins     Fever/hives, happened as a child    Recent Results (from the past 2160 hour(s))  Comprehensive metabolic panel     Status: None   Collection Time: 08/13/21  9:37 AM  Result Value Ref Range   Sodium 137 135 - 145 mEq/L   Potassium 4.2 3.5 - 5.1 mEq/L   Chloride 99 96 - 112 mEq/L   CO2 31 19 - 32 mEq/L   Glucose, Bld 85 70 - 99 mg/dL   BUN 18 6 - 23 mg/dL   Creatinine, Ser 0.64 0.40 - 1.20 mg/dL   Total Bilirubin 0.9 0.2 - 1.2 mg/dL   Alkaline Phosphatase 70 39 - 117 U/L   AST 22 0 - 37 U/L   ALT 24 0 - 35 U/L   Total Protein 7.0 6.0 - 8.3 g/dL   Albumin 4.6 3.5 - 5.2 g/dL   GFR 96.68 >60.00 mL/min    Comment: Calculated using the CKD-EPI Creatinine Equation (2021)   Calcium 9.5 8.4 - 10.5 mg/dL  Lipid panel     Status: Abnormal   Collection Time: 08/13/21  9:37 AM  Result Value Ref Range   Cholesterol 255 (H) 0 - 200 mg/dL    Comment: ATP III Classification       Desirable:  < 200 mg/dL               Borderline High:  200 - 239 mg/dL          High:  > = 240 mg/dL   Triglycerides 90.0 0.0 - 149.0 mg/dL    Comment: Normal:  <150 mg/dLBorderline High:  150 - 199 mg/dL   HDL 99.60 >39.00 mg/dL   VLDL 18.0 0.0 - 40.0 mg/dL  LDL Cholesterol 137 (H) 0 - 99 mg/dL   Total CHOL/HDL Ratio 3     Comment:                Men          Women1/2 Average Risk     3.4          3.3Average Risk          5.0          4.42X Average Risk          9.6          7.13X  Average Risk          15.0          11.0                       NonHDL 155.37     Comment: NOTE:  Non-HDL goal should be 30 mg/dL higher than patient's LDL goal (i.e. LDL goal of < 70 mg/dL, would have non-HDL goal of < 100 mg/dL)  CBC with Differential/Platelet     Status: Abnormal   Collection Time: 08/13/21  9:37 AM  Result Value Ref Range   WBC 5.3 4.0 - 10.5 K/uL   RBC 4.48 3.87 - 5.11 Mil/uL   Hemoglobin 14.3 12.0 - 15.0 g/dL   HCT 41.8 36.0 - 46.0 %   MCV 93.2 78.0 - 100.0 fl   MCHC 34.1 30.0 - 36.0 g/dL   RDW 13.2 11.5 - 15.5 %   Platelets 298.0 150.0 - 400.0 K/uL   Neutrophils Relative % 57.3 43.0 - 77.0 %   Lymphocytes Relative 25.4 12.0 - 46.0 %   Monocytes Relative 11.0 3.0 - 12.0 %   Eosinophils Relative 5.3 (H) 0.0 - 5.0 %   Basophils Relative 1.0 0.0 - 3.0 %   Neutro Abs 3.0 1.4 - 7.7 K/uL   Lymphs Abs 1.3 0.7 - 4.0 K/uL   Monocytes Absolute 0.6 0.1 - 1.0 K/uL   Eosinophils Absolute 0.3 0.0 - 0.7 K/uL   Basophils Absolute 0.1 0.0 - 0.1 K/uL  TSH     Status: None   Collection Time: 08/13/21  9:37 AM  Result Value Ref Range   TSH 3.12 0.35 - 5.50 uIU/mL  Urinalysis, Routine w reflex microscopic     Status: None   Collection Time: 08/13/21  9:37 AM  Result Value Ref Range   Color, Urine YELLOW YELLOW   APPearance CLEAR CLEAR   Specific Gravity, Urine 1.011 1.001 - 1.035   pH 7.0 5.0 - 8.0   Glucose, UA NEGATIVE NEGATIVE   Bilirubin Urine NEGATIVE NEGATIVE   Ketones, ur NEGATIVE NEGATIVE   Hgb urine dipstick NEGATIVE NEGATIVE   Protein, ur NEGATIVE NEGATIVE   Nitrite NEGATIVE NEGATIVE   Leukocytes,Ua NEGATIVE NEGATIVE   Objective  Body mass index is 22.78 kg/m. Wt Readings from Last 3 Encounters:  10/16/21 128 lb 9.6 oz (58.3 kg)  08/13/21 130 lb (59 kg)  10/08/20 129 lb (58.5 kg)   Temp Readings from Last 3 Encounters:  10/16/21 98.1 F (36.7 C) (Oral)  08/13/21 98.2 F (36.8 C) (Oral)  02/05/20 98.2 F (36.8 C) (Oral)   BP Readings from Last 3  Encounters:  10/16/21 122/62  08/13/21 124/84  02/05/20 130/80   Pulse Readings from Last 3 Encounters:  10/16/21 80  08/13/21 61  02/05/20 82    Physical Exam Vitals and nursing note reviewed.  Constitutional:  Appearance: Normal appearance. She is well-developed and well-groomed.  HENT:     Head: Normocephalic and atraumatic.  Eyes:     Conjunctiva/sclera: Conjunctivae normal.     Pupils: Pupils are equal, round, and reactive to light.  Cardiovascular:     Rate and Rhythm: Normal rate and regular rhythm.     Heart sounds: Normal heart sounds. No murmur heard. Pulmonary:     Effort: Pulmonary effort is normal.     Breath sounds: Normal breath sounds.  Abdominal:     General: Abdomen is flat. Bowel sounds are normal.     Tenderness: There is no abdominal tenderness.  Musculoskeletal:        General: No tenderness.     Right hand: Swelling present.       Arms:  Skin:    General: Skin is warm and dry.  Neurological:     General: No focal deficit present.     Mental Status: She is alert and oriented to person, place, and time. Mental status is at baseline.     Cranial Nerves: Cranial nerves 2-12 are intact.     Motor: Motor function is intact.     Coordination: Coordination is intact.     Gait: Gait is intact.  Psychiatric:        Attention and Perception: Attention and perception normal.        Mood and Affect: Mood and affect normal.        Speech: Speech normal.        Behavior: Behavior normal. Behavior is cooperative.        Thought Content: Thought content normal.        Cognition and Memory: Cognition and memory normal.        Judgment: Judgment normal.     Assessment  Plan  Sprain of interphalangeal joint of right index finger, initial encounter Finger pain, right - Plan: DG Finger Index Right negative Can buddy tape Given splint in office today and Rx for splint if needed If not better call [facility] for hand ortho  Rest  Ice*  Elevation   Finger splint  F/u ortho call [facility] Tylenol  Aspercream with lidocaine Voltaren  Dermatitis - Plan: fluocinonide cream (LIDEX) 0.05 %   HM Flu shot utd  Tdap rx previously given moderna 3/3 consider booster covid 19 09/16/20 + declines 2/2 shingrix  Not immune hep B Declines HIV testing, hep C negative    01/05/16 div and IH, hyperplastic with FH due 5 colonoscopy Dr. Hilarie Fredrickson  01/04/21    Never smoker   Pap had 05/19/16 neg except BV neg HPV f/u [facility] [place] in [place] 05/19/18, pap 12/06/19 negative    Colonoscopy sch 11/16/21   05/17/16 negative Mammogram had [facility]  -mamo 12/06/19 negative right breast fibroglandular changes    -CTA chest 12/06/17 +left breast mass ? Cyst/cyst collection vs fibroadenoma needs b/l diagnostic mammogram with [place] (see above 11/2019 mammogram)    Reviewed vitamin B12 06/30/17 1770 high    Of note chronic back/neck pain prev referred NS but I do not believe pt ever saw as of 01/2020   Skin [place] derm 01/2020 saw Dr. Ubaldo Glassing   rec healthy diet and exercise    Provider: Dr. Olivia Mackie McLean-Scocuzza-Internal Medicine

## 2021-10-26 ENCOUNTER — Ambulatory Visit (AMBULATORY_SURGERY_CENTER): Payer: Self-pay | Admitting: *Deleted

## 2021-10-26 VITALS — Ht 63.0 in | Wt 128.6 lb

## 2021-10-26 DIAGNOSIS — Z800 Family history of malignant neoplasm of digestive organs: Secondary | ICD-10-CM

## 2021-10-26 MED ORDER — NA SULFATE-K SULFATE-MG SULF 17.5-3.13-1.6 GM/177ML PO SOLN: 1.0000 | Freq: Once | ORAL | 0 refills | Status: AC

## 2021-10-26 NOTE — Progress Notes (Signed)
No egg or soy allergy known to patient  No issues known to pt with past sedation with any surgeries or procedures Patient denies ever being told they had issues or difficulty with intubation  No FH of Malignant Hyperthermia Pt is not on diet pills Pt is not on  home 02  Pt is not on blood thinners  Pt denies issues with constipation  No A fib or A flutter Have any cardiac testing pending--NO Pt instructed to use [email or web address] or GoodRx for a price reduction on prep   Pt. Stated "I had a hard time with prep last time,showed her different prep and asked pt,if wanted zofran and she declined during pre-visit,# provided for any problems for preping or questions.

## 2021-10-27 DIAGNOSIS — K219 Gastro-esophageal reflux disease without esophagitis: Secondary | ICD-10-CM | POA: Insufficient documentation

## 2021-10-27 DIAGNOSIS — K5730 Diverticulosis of large intestine without perforation or abscess without bleeding: Secondary | ICD-10-CM | POA: Insufficient documentation

## 2021-10-27 DIAGNOSIS — N8189 Other female genital prolapse: Secondary | ICD-10-CM | POA: Insufficient documentation

## 2021-10-27 DIAGNOSIS — R32 Unspecified urinary incontinence: Secondary | ICD-10-CM | POA: Insufficient documentation

## 2021-11-10 ENCOUNTER — Encounter: Payer: Self-pay | Admitting: Internal Medicine

## 2021-11-16 ENCOUNTER — Ambulatory Visit (AMBULATORY_SURGERY_CENTER): Payer: No Typology Code available for payment source | Admitting: Internal Medicine

## 2021-11-16 ENCOUNTER — Encounter: Payer: Self-pay | Admitting: Internal Medicine

## 2021-11-16 VITALS — BP 117/76 | HR 56 | Temp 97.1°F | Resp 13 | Ht 63.0 in | Wt 128.6 lb

## 2021-11-16 DIAGNOSIS — Z800 Family history of malignant neoplasm of digestive organs: Secondary | ICD-10-CM

## 2021-11-16 DIAGNOSIS — Z1211 Encounter for screening for malignant neoplasm of colon: Secondary | ICD-10-CM | POA: Diagnosis present

## 2021-11-16 DIAGNOSIS — K5140 Inflammatory polyps of colon without complications: Secondary | ICD-10-CM | POA: Diagnosis not present

## 2021-11-16 DIAGNOSIS — D123 Benign neoplasm of transverse colon: Secondary | ICD-10-CM

## 2021-11-16 MED ORDER — SODIUM CHLORIDE 0.9 % IV SOLN: 500.0000 mL | Freq: Once | INTRAVENOUS | Status: DC

## 2021-11-16 NOTE — Progress Notes (Signed)
Report to PACU, RN, vss, BBS= Clear.  

## 2021-11-16 NOTE — Progress Notes (Signed)
GASTROENTEROLOGY PROCEDURE H&P NOTE   Primary Care Physician: McLean-Scocuzza, Nino Glow, MD    Reason for Procedure:  Family history of colon cancer  Plan:    Screening colonoscopy  Patient is appropriate for endoscopic procedure(s) in the ambulatory ([place]) setting.  The nature of the procedure, as well as the risks, benefits, and alternatives were carefully and thoroughly reviewed with the patient. Ample time for discussion and questions allowed. The patient understood, was satisfied, and agreed to proceed.     HPI: Kathy Riley is a 61 y.o. female who presents for colonoscopy.  Medical history as below.  Tolerated the prep.  No recent chest pain or shortness of breath.  No abdominal pain today.  Past Medical History:  Diagnosis Date   Allergy    Arthritis    HANDS   Bacterial vaginosis    Cervicalgia    Chicken pox    Colon polyps    COVID-19    09/16/20   Diverticulitis    Fibroid    GERD (gastroesophageal reflux disease)    Hyperlipidemia    03/17/17 TC 253, HDL 115 per review of notes   Hypertension    Irregular menses    Kidney stones    Mid back pain    Osteoporosis    Ovarian cyst    UTI (urinary tract infection)     Past Surgical History:  Procedure Laterality Date   2008 fibroid surgery      laparoscopy    APPENDECTOMY     BREAST SURGERY     right breast benign bx   COLONOSCOPY     COLONOSCOPY W/ POLYPECTOMY     2014    DILATION AND CURETTAGE OF UTERUS     x 3 2/2 miscarriages    TONSILLECTOMY     WISDOM TOOTH EXTRACTION      Prior to Admission medications   Medication Sig Start Date End Date Taking? Authorizing Provider  Cholecalciferol (VITAMIN D3 PO) Take 10,000 Units by mouth daily.    Yes [provider]  fexofenadine (ALLEGRA) 180 MG tablet Take 180 mg by mouth at bedtime as needed for allergies or rhinitis.   Yes [provider]  fluocinonide cream (LIDEX) 3.15 % Apply 1 Application topically 2 (two) times  daily. Poison ivy, bug bites not for face/groin/underarms 10/16/21  Yes McLean-Scocuzza, Nino Glow, MD  hydrochlorothiazide (MICROZIDE) 12.5 MG capsule TAKE 1 CAPSULE BY MOUTH EVERY DAY IN THE MORNING 08/13/21  Yes McLean-Scocuzza, Nino Glow, MD  YUVAFEM 10 MCG TABS vaginal tablet Place 1 tablet (10 mcg total) vaginally once a week. 08/13/21  Yes McLean-Scocuzza, Nino Glow, MD  ipratropium (ATROVENT) 0.06 % nasal spray PLACE 2 SPRAYS INTO BOTH NOSTRILS 3 (THREE) TIMES DAILY AS NEEDED for post nasal drip Patient not taking: Reported on 10/26/2021 10/08/20   McLean-Scocuzza, Nino Glow, MD  naproxen (NAPROSYN) 500 MG tablet Take 1 tablet (500 mg total) by mouth 2 (two) times daily with a meal. Patient not taking: Reported on 10/26/2021 08/13/21   McLean-Scocuzza, Nino Glow, MD  Olopatadine HCl 0.2 % SOLN Apply 1 drop to eye daily. 08/01/18   McLean-Scocuzza, Nino Glow, MD  pravastatin (PRAVACHOL) 20 MG tablet Take 1 tablet (20 mg total) by mouth daily. After 6 pm Patient not taking: Reported on 10/26/2021 08/14/21   McLean-Scocuzza, Nino Glow, MD  sodium chloride (OCEAN) 0.65 % SOLN nasal spray Place 2 sprays into both nostrils as needed for congestion. Patient not taking: Reported on 10/26/2021 10/08/20  McLean-Scocuzza, Nino Glow, MD    Current Outpatient Medications  Medication Sig Dispense Refill   Cholecalciferol (VITAMIN D3 PO) Take 10,000 Units by mouth daily.      fexofenadine (ALLEGRA) 180 MG tablet Take 180 mg by mouth at bedtime as needed for allergies or rhinitis.     fluocinonide cream (LIDEX) 0.63 % Apply 1 Application topically 2 (two) times daily. Poison ivy, bug bites not for face/groin/underarms 30 g 2   hydrochlorothiazide (MICROZIDE) 12.5 MG capsule TAKE 1 CAPSULE BY MOUTH EVERY DAY IN THE MORNING 90 capsule 3   YUVAFEM 10 MCG TABS vaginal tablet Place 1 tablet (10 mcg total) vaginally once a week. 9 tablet 2   ipratropium (ATROVENT) 0.06 % nasal spray PLACE 2 SPRAYS INTO BOTH NOSTRILS 3 (THREE) TIMES DAILY  AS NEEDED for post nasal drip (Patient not taking: Reported on 10/26/2021) 15 mL 2   naproxen (NAPROSYN) 500 MG tablet Take 1 tablet (500 mg total) by mouth 2 (two) times daily with a meal. (Patient not taking: Reported on 10/26/2021) 180 tablet 1   Olopatadine HCl 0.2 % SOLN Apply 1 drop to eye daily. 1 Bottle 2   pravastatin (PRAVACHOL) 20 MG tablet Take 1 tablet (20 mg total) by mouth daily. After 6 pm (Patient not taking: Reported on 10/26/2021) 90 tablet 3   sodium chloride (OCEAN) 0.65 % SOLN nasal spray Place 2 sprays into both nostrils as needed for congestion. (Patient not taking: Reported on 10/26/2021) 30 mL 2   Current Facility-Administered Medications  Medication Dose Route Frequency Provider Last Rate Last Admin   0.9 %  sodium chloride infusion  500 mL Intravenous Once Mardel Grudzien, Lajuan Lines, MD        Allergies as of 11/16/2021 - Review Complete 11/16/2021  Allergen Reaction Noted   Ibuprofen Other (See Comments) 10/14/2017   Penicillins  10/08/2014    Family History  Problem Relation Age of Onset   Hypertension Mother    Dementia Mother        61/62   Prostate cancer Father    Alcohol abuse Father    Hypertension Father    Colon cancer Father        44   Colon polyps Brother    Stroke Brother    Stroke Brother    Dementia Maternal Aunt    Cancer - Ovarian Maternal Aunt    Stroke Maternal Grandmother    Hypertension Maternal Grandmother    Hypertension Maternal Grandfather    Hypertension Paternal Grandmother    Stroke Paternal Grandfather    Hypertension Paternal Grandfather    Colon cancer Other        62-70   Crohn's disease Neg Hx    Esophageal cancer Neg Hx    Rectal cancer Neg Hx    Stomach cancer Neg Hx     Social History   Socioeconomic History   Marital status: Divorced    Spouse name: Not on file   Number of children: 0   Years of education: 16   Highest education level: Not on file  Occupational History   Occupation: Set designer  Tobacco Use    Smoking status: Never    Passive exposure: Never   Smokeless tobacco: Never  Vaping Use   Vaping Use: Never used  Substance and Sexual Activity   Alcohol use: Yes    Comment: twice A DAY   Drug use: No   Sexual activity: Yes    Birth control/protection: None  Other Topics Concern  Not on file  Social History Narrative   Fun: Relax, swim, exercise    Denies religious beliefs effecting health care.   Denies abuse and feels safe at home.    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 [employer]      Never smoker/chew   [place] 1-2 glasses qhs    No illegal drugs    Social Determinants of Radio broadcast assistant Strain: Not on file  Food Insecurity: Not on file  Transportation Needs: Not on file  Physical Activity: Not on file  Stress: Not on file  Social Connections: Not on file  Intimate Partner Violence: Not on file    Physical Exam: Vital signs in last 24 hours: '@BP'$  138/88   Pulse 72   Temp (!) 97.1 F (36.2 C)   Ht '5\' 3"'$  (1.6 m)   Wt 128 lb 9.6 oz (58.3 kg)   SpO2 99%   BMI 22.78 kg/m  GEN: NAD EYE: Sclerae anicteric ENT: MMM CV: Non-tachycardic Pulm: CTA b/l GI: Soft, NT/ND NEURO:  Alert & Oriented x 3   Zenovia Jarred, MD [facility]  11/16/2021 10:03 AM

## 2021-11-16 NOTE — Progress Notes (Signed)
Called to room to assist during endoscopic procedure.  Patient ID and intended procedure confirmed with present staff. Received instructions for my participation in the procedure from the performing physician.  

## 2021-11-16 NOTE — Op Note (Signed)
[place] Patient Name: Kathy Riley Procedure Date: 11/16/2021 10:06 AM MRN: [id number] Endoscopist: Jerene Bears , MD Age: 61 Referring MD:  Date of Birth: December 25, 1960 Gender: Female Account #: [id number] Procedure:                Colonoscopy Indications:              Screening in patient at increased risk: Family                            history of 1st-degree relative (father) with                            colorectal cancer, Last colonoscopy: October 2017 Medicines:                Monitored Anesthesia Care Procedure:                Pre-Anesthesia Assessment:                           - Prior to the procedure, a History and Physical                            was performed, and patient medications and                            allergies were reviewed. The patient's tolerance of                            previous anesthesia was also reviewed. The risks                            and benefits of the procedure and the sedation                            options and risks were discussed with the patient.                            All questions were answered, and informed consent                            was obtained. Prior Anticoagulants: The patient has                            taken no previous anticoagulant or antiplatelet                            agents. ASA Grade Assessment: II - A patient with                            mild systemic disease. After reviewing the risks                            and benefits, the patient was deemed in  satisfactory condition to undergo the procedure.                           After obtaining informed consent, the colonoscope                            was passed under direct vision. Throughout the                            procedure, the patient's blood pressure, pulse, and                            oxygen saturations were monitored continuously. The                            Olympus  [id number]-H190DL (#[id number]) Colonoscope was                            introduced through the anus and advanced to the                            cecum, identified by appendiceal orifice and                            ileocecal valve. The colonoscopy was performed                            without difficulty. The patient tolerated the                            procedure well. The quality of the bowel                            preparation was good. The ileocecal valve,                            appendiceal orifice, and rectum were photographed. Scope In: 10:10:17 AM Scope Out: 10:22:45 AM Scope Withdrawal Time: 0 hours 9 minutes 6 seconds  Total Procedure Duration: 0 hours 12 minutes 28 seconds  Findings:                 The digital rectal exam was normal.                           A 3 mm polyp was found in the transverse colon. The                            polyp was sessile. The polyp was removed with a                            cold snare. Resection and retrieval were complete.                           Multiple small-mouthed diverticula were found in  the sigmoid colon, hepatic flexure and ascending                            colon.                           The exam was otherwise without abnormality on                            direct and retroflexion views. Complications:            No immediate complications. Estimated Blood Loss:     Estimated blood loss: none. Impression:               - One 3 mm polyp in the transverse colon, removed                            with a cold snare. Resected and retrieved.                           - Mild diverticulosis in the sigmoid colon, at the                            hepatic flexure and in the ascending colon.                           - The examination was otherwise normal on direct                            and retroflexion views. Recommendation:           - Patient has a contact number available for                             emergencies. The signs and symptoms of potential                            delayed complications were discussed with the                            patient. Return to normal activities tomorrow.                            Written discharge instructions were provided to the                            patient.                           - Resume previous diet.                           - Continue present medications.                           - Await pathology results.                           -  Repeat colonoscopy in 5 years. Jerene Bears, MD 11/16/2021 10:26:12 AM This report has been signed electronically.

## 2021-11-16 NOTE — Progress Notes (Signed)
Pt's states no medical or surgical changes since previsit or office visit. 

## 2021-11-16 NOTE — Patient Instructions (Signed)
Resume previous medications.  1 polyp removed ans sent to pathology.  Await results for final recommendations.    Handouts on findings given to patient.   (Diverticulosis and polyps)  YOU HAD AN ENDOSCOPIC PROCEDURE TODAY AT [facility]:   Refer to the procedure report that was given to you for any specific questions about what was found during the examination.  If the procedure report does not answer your questions, please call your gastroenterologist to clarify.  If you requested that your care partner not be given the details of your procedure findings, then the procedure report has been included in a sealed envelope for you to review at your convenience later.  YOU SHOULD EXPECT: Some feelings of bloating in the abdomen. Passage of more gas than usual.  Walking can help get rid of the air that was put into your GI tract during the procedure and reduce the bloating. If you had a lower endoscopy (such as a colonoscopy or flexible sigmoidoscopy) you may notice spotting of blood in your stool or on the toilet paper. If you underwent a bowel prep for your procedure, you may not have a normal bowel movement for a few days.  Please Note:  You might notice some irritation and congestion in your nose or some drainage.  This is from the oxygen used during your procedure.  There is no need for concern and it should clear up in a day or so.  SYMPTOMS TO REPORT IMMEDIATELY:  Following lower endoscopy (colonoscopy or flexible sigmoidoscopy):  Excessive amounts of blood in the stool  Significant tenderness or worsening of abdominal pains  Swelling of the abdomen that is new, acute  Fever of 100F or higher    For urgent or emergent issues, a gastroenterologist can be reached at any hour by calling [phone]. Do not use MyChart messaging for urgent concerns.    DIET:  We do recommend a small meal at first, but then you may proceed to your regular diet.  Drink plenty of fluids but  you should avoid alcoholic beverages for 24 hours.  ACTIVITY:  You should plan to take it easy for the rest of today and you should NOT DRIVE or use heavy machinery until tomorrow (because of the sedation medicines used during the test).    FOLLOW UP: Our staff will call the number listed on your records the next business day following your procedure.  We will call around 7:15- 8:00 am to check on you and address any questions or concerns that you may have regarding the information given to you following your procedure. If we do not reach you, we will leave a message.  If you develop any symptoms (ie: fever, flu-like symptoms, shortness of breath, cough etc.) before then, please call [phone].  If you test positive for Covid 19 in the 2 weeks post procedure, please call and report this information to [place].    If any biopsies were taken you will be contacted by phone or by letter within the next 1-3 weeks.  Please call us at [phone] if you have not heard about the biopsies in 3 weeks.    SIGNATURES/CONFIDENTIALITY: You and/or your care partner have signed paperwork which will be entered into your electronic medical record.  These signatures attest to the fact that that the information above on your After Visit Summary has been reviewed and is understood.  Full responsibility of the confidentiality of this discharge information lies with you and/or your care-partner.

## 2021-11-17 ENCOUNTER — Telehealth: Payer: Self-pay

## 2021-11-17 NOTE — Telephone Encounter (Signed)
Follow up call to pt, lm for pt to call if having any difficulty with normal activities or eating and drinking.  Also to call if any other questions or concerns.  

## 2021-11-19 ENCOUNTER — Encounter: Payer: Self-pay | Admitting: Internal Medicine

## 2022-05-21 ENCOUNTER — Ambulatory Visit: Payer: No Typology Code available for payment source | Admitting: Nurse Practitioner

## 2022-08-08 NOTE — Progress Notes (Deleted)
   SUBJECTIVE:  No chief complaint on file.  HPI ***  PERTINENT PMH / PSH: HLD HTN  OBJECTIVE:  There were no vitals taken for this visit.   Physical Exam  ASSESSMENT/PLAN:  Breast cancer screening by mammogram   PDMP reviewed***  No follow-ups on file.  Dana Allan, MD

## 2022-08-09 ENCOUNTER — Encounter: Payer: No Typology Code available for payment source | Admitting: Family Medicine

## 2022-08-09 DIAGNOSIS — Z1231 Encounter for screening mammogram for malignant neoplasm of breast: Secondary | ICD-10-CM

## 2022-10-07 ENCOUNTER — Encounter: Payer: No Typology Code available for payment source | Admitting: Family Medicine

## 2022-11-30 ENCOUNTER — Other Ambulatory Visit: Payer: Self-pay | Admitting: Family Medicine

## 2022-11-30 DIAGNOSIS — I10 Essential (primary) hypertension: Secondary | ICD-10-CM

## 2022-11-30 NOTE — Telephone Encounter (Signed)
Patient is out of her medication hydrochlorothiazide (MICROZIDE) 12.5 MG capsule. She has an appoinment set up but its not until next month and she is almost out. The pharmacy she uses is [facility] [phone], [place] - [address] [place], [place] [address] Phone: [phone]  Fax: [phone]

## 2022-12-01 NOTE — Telephone Encounter (Signed)
Last seen by Dr. Lonie Peak in July 2023, has TOC appt next month with Dr. Clent Ridges.  Okay for a 30 day supply? Med pended below for approval.

## 2022-12-02 ENCOUNTER — Encounter: Payer: Self-pay | Admitting: *Deleted

## 2022-12-02 MED ORDER — HYDROCHLOROTHIAZIDE 12.5 MG PO CAPS: ORAL_CAPSULE | ORAL | 0 refills | Status: DC

## 2022-12-24 ENCOUNTER — Ambulatory Visit: Payer: Commercial Managed Care - PPO | Admitting: Family Medicine

## 2022-12-24 ENCOUNTER — Encounter: Payer: Self-pay | Admitting: Family Medicine

## 2022-12-24 VITALS — BP 126/74 | HR 72 | Temp 98.0°F | Resp 16 | Ht 63.0 in | Wt 130.2 lb

## 2022-12-24 DIAGNOSIS — H524 Presbyopia: Secondary | ICD-10-CM

## 2022-12-24 DIAGNOSIS — Z114 Encounter for screening for human immunodeficiency virus [HIV]: Secondary | ICD-10-CM

## 2022-12-24 DIAGNOSIS — R413 Other amnesia: Secondary | ICD-10-CM

## 2022-12-24 DIAGNOSIS — R232 Flushing: Secondary | ICD-10-CM

## 2022-12-24 DIAGNOSIS — I10 Essential (primary) hypertension: Secondary | ICD-10-CM

## 2022-12-24 DIAGNOSIS — M791 Myalgia, unspecified site: Secondary | ICD-10-CM

## 2022-12-24 DIAGNOSIS — R5383 Other fatigue: Secondary | ICD-10-CM

## 2022-12-24 DIAGNOSIS — R7309 Other abnormal glucose: Secondary | ICD-10-CM

## 2022-12-24 DIAGNOSIS — E559 Vitamin D deficiency, unspecified: Secondary | ICD-10-CM

## 2022-12-24 DIAGNOSIS — E538 Deficiency of other specified B group vitamins: Secondary | ICD-10-CM | POA: Diagnosis not present

## 2022-12-24 DIAGNOSIS — Z1231 Encounter for screening mammogram for malignant neoplasm of breast: Secondary | ICD-10-CM

## 2022-12-24 DIAGNOSIS — E785 Hyperlipidemia, unspecified: Secondary | ICD-10-CM

## 2022-12-24 DIAGNOSIS — R32 Unspecified urinary incontinence: Secondary | ICD-10-CM

## 2022-12-24 DIAGNOSIS — Z1159 Encounter for screening for other viral diseases: Secondary | ICD-10-CM

## 2022-12-24 MED ORDER — HYDROCHLOROTHIAZIDE 12.5 MG PO CAPS
ORAL_CAPSULE | ORAL | 0 refills | Status: DC
Start: 2022-12-24 — End: 2023-01-31

## 2022-12-24 NOTE — Progress Notes (Signed)
SUBJECTIVE:   Chief Complaint  Patient presents with   Establish Care    Transferring from [facility]   HPI Patient presents to clinic to establish care  Discussed the use of AI scribe software for clinical note transcription with the patient, who gave verbal consent to proceed.  History of Present Illness The patient, recently transferred care from Dr. French Ana, presents with concerns of decreased energy levels and generalized body soreness. These symptoms have been ongoing for approximately eight months and have not been evaluated by a healthcare provider. The patient reports waking up with these symptoms, which seem to improve slightly with morning exercise but return as the day progresses. The patient has also noticed a decrease in strength and increased pain in their hands and feet, which has now generalized to the entire body.  The patient has been employed since June and has noticed a gradual onset of fatigue since starting their new job. They are currently taking hydrochlorothiazide for hypertension, which they have been on for several years, and Allegra daily. They also take a daily vitamin D supplement and several other supplements including zinc, vitamin C, quercetin, and magnesium citrate. Occasionally, they take melatonin for sleep issues.  The patient has also noticed changes in their eyes, experiencing occasional blurriness and sharp pains. They have not had an eye examination in a long time. They also report an increase in incontinence and concerns about memory loss. They have previously seen a specialist for memory concerns, but no significant issues were identified at that time. MOCA score 27/30 on 10/14/2017, completed at Memory clinic at [facility]. The patient has a family history of dementia and Alzheimer's disease, which causes them some anxiety.  In addition to these concerns, the patient has been experiencing increased gas for the past eight months,  requiring daily Gas-X. They report no changes in diet and try to maintain a healthy eating pattern. The patient has not seen a gastroenterologist for this issue. They have had a colonoscopy, the results of which were reportedly normal. The patient is due for a mammogram and cervical cancer screening, which are scheduled in the next two weeks. They have not had a tetanus shot in the past ten years.    PERTINENT PMH / PSH: As above  OBJECTIVE:  BP 126/74   Pulse 72   Temp 98 F (36.7 C)   Resp 16   Ht 5\' 3"  (1.6 m)   Wt 130 lb 4 oz (59.1 kg)   SpO2 97%   BMI 23.07 kg/m    Physical Exam Vitals reviewed.  Constitutional:      General: She is not in acute distress.    Appearance: Normal appearance. She is not ill-appearing.  HENT:     Head: Normocephalic.     Right Ear: Tympanic membrane, ear canal and external ear normal.     Left Ear: Tympanic membrane, ear canal and external ear normal.     Nose: Nose normal.     Mouth/Throat:     Mouth: Mucous membranes are moist.  Eyes:     Extraocular Movements: Extraocular movements intact.     Conjunctiva/sclera: Conjunctivae normal.     Pupils: Pupils are equal, round, and reactive to light.  Neck:     Thyroid: No thyromegaly or thyroid tenderness.     Vascular: No carotid bruit.  Cardiovascular:     Rate and Rhythm: Normal rate and regular rhythm.     Pulses: Normal pulses.  Heart sounds: Normal heart sounds.  Pulmonary:     Effort: Pulmonary effort is normal.     Breath sounds: Normal breath sounds. No wheezing or rhonchi.  Abdominal:     General: Bowel sounds are normal. There is no distension.     Palpations: Abdomen is soft.     Tenderness: There is no abdominal tenderness. There is no right CVA tenderness, left CVA tenderness, guarding or rebound.  Musculoskeletal:        General: Normal range of motion.     Cervical back: Normal range of motion.     Right lower leg: No edema.     Left lower leg: No edema.   Lymphadenopathy:     Cervical: No cervical adenopathy.  Skin:    General: Skin is warm and dry.     Capillary Refill: Capillary refill takes less than 2 seconds.  Neurological:     General: No focal deficit present.     Mental Status: She is alert and oriented to person, place, and time. Mental status is at baseline.     Motor: No weakness.  Psychiatric:        Mood and Affect: Mood normal.        Behavior: Behavior normal.        Thought Content: Thought content normal.        Judgment: Judgment normal.        12/24/2022    8:28 AM 10/16/2021    3:08 PM 08/13/2021    8:27 AM 02/05/2020    2:50 PM 08/01/2018   10:54 AM  Depression screen PHQ 2/9  Decreased Interest 2 0 1 0 0  Down, Depressed, Hopeless 0 0 0 0 0  PHQ - 2 Score 2 0 1 0 0  Altered sleeping 2      Tired, decreased energy 3      Change in appetite 0      Feeling bad or failure about yourself  0      Trouble concentrating 0      Moving slowly or fidgety/restless 0      Suicidal thoughts 0      PHQ-9 Score 7      Difficult doing work/chores Not difficult at all          12/24/2022    8:28 AM  GAD 7 : Generalized Anxiety Score  Nervous, Anxious, on Edge 3  Control/stop worrying 2  Worry too much - different things 2  Trouble relaxing 2  Restless 0  Easily annoyed or irritable 1  Afraid - awful might happen 0  Total GAD 7 Score 10  Anxiety Difficulty Not difficult at all    ASSESSMENT/PLAN:  Essential hypertension Assessment & Plan: Well-controlled on Hydrochlorothiazide 12.5mg  daily. -Continue Hydrochlorothiazide 12.5mg  daily. -Check electrolytes with blood work to assess for any imbalance.  Orders: -     hydroCHLOROthiazide; TAKE 1 CAPSULE BY MOUTH EVERY DAY IN THE MORNING  Dispense: 30 capsule; Refill: 0 -     Comprehensive metabolic panel; Future  Encounter for screening for HIV -     HIV Antibody (routine testing w rflx); Future  Need for hepatitis C screening test -     Hepatitis C  antibody; Future  Breast cancer screening by mammogram Assessment & Plan: -Ensure mammogram and Pap smear are completed as scheduled with OB/GYN -Patient reports has mammogram scheduled in [place].   Vitamin B 12 deficiency -     Vitamin B12; Future  Vitamin D deficiency -  VITAMIN D 25 Hydroxy (Vit-D Deficiency, Fractures); Future  Abnormal glucose -     Hemoglobin A1c; Future  Hyperlipidemia, unspecified hyperlipidemia type Assessment & Plan: Check fasting lipids  Orders: -     Lipid panel; Future  Memory changes Assessment & Plan: Patient reports difficulty with name recall and concerns about potential memory loss. Family history of dementia and Alzheimer's disease. -Consider referral to neurology if memory concerns persist or worsen.    Other fatigue Assessment & Plan: Possibly related to new employment. No clear etiology identified during the visit. Chart review appears has has history of fatigue and myalgia dated back to 2019. -Order comprehensive blood work including CBC, CMP, TSH, Vitamin D, and B12 levels. -Schedule follow-up appointment in 2 weeks to discuss results.  Orders: -     CBC with Differential/Platelet; Future -     TSH; Future  Myalgia Assessment & Plan: Possibly related to new employment. No clear etiology identified during the visit. Chart review appears has has history of fatigue and myalgia dated back to 2019. -Order comprehensive blood work including CBC, CMP, TSH, Vitamin D, and B12 levels. -Schedule follow-up appointment in 2 weeks to discuss results.   Urinary incontinence, unspecified type Assessment & Plan: Worsening symptoms, no further details provided during the visit. -Kegel exercises frequently -Consider Pelvic Floor therapy -Consider referral to urology if symptoms persist or worsen.   Hot flashes Assessment & Plan: -Continue Estradiol as prescribed by OB/GYN.   Presbyopia Assessment & Plan: -Consider eye exam  with optometrist or ophthalmologist for reported eye discomfort and blurriness.     PDMP reviewed  Return in about 2 weeks (around 01/07/2023) for PCP.  Dana Allan, MD

## 2022-12-24 NOTE — Patient Instructions (Signed)
It was a pleasure meeting you today. Thank you for allowing me to take part in your health care.  Our goals for today as we discussed include:  Schedule appointment for fasting labs.  Fast 10 hours  Follow up 2 weeks   This is a list of the screening recommended for you and due dates:  Health Maintenance  Topic Date Due   HIV Screening  Never done   DTaP/Tdap/Td vaccine (1 - Tdap) Never done   Mammogram  12/05/2021   COVID-19 Vaccine (5 - 2023-24 season) 11/21/2022   Pap with HPV screening  12/06/2022   Flu Shot  06/20/2023*   Colon Cancer Screening  11/17/2026   Hepatitis C Screening  Completed   Zoster (Shingles) Vaccine  Completed   HPV Vaccine  Aged Out  *Topic was postponed. The date shown is not the original due date.      If you have any questions or concerns, please do not hesitate to call the office at [phone].  I look forward to our next visit and until then take care and stay safe.  Regards,   Dana Allan, MD   [facility]

## 2022-12-28 ENCOUNTER — Other Ambulatory Visit: Payer: Self-pay | Admitting: Family Medicine

## 2022-12-28 DIAGNOSIS — I10 Essential (primary) hypertension: Secondary | ICD-10-CM

## 2022-12-30 ENCOUNTER — Other Ambulatory Visit: Payer: Commercial Managed Care - PPO

## 2022-12-30 DIAGNOSIS — R7309 Other abnormal glucose: Secondary | ICD-10-CM

## 2022-12-30 DIAGNOSIS — E559 Vitamin D deficiency, unspecified: Secondary | ICD-10-CM | POA: Diagnosis not present

## 2022-12-30 DIAGNOSIS — E785 Hyperlipidemia, unspecified: Secondary | ICD-10-CM

## 2022-12-30 DIAGNOSIS — I10 Essential (primary) hypertension: Secondary | ICD-10-CM

## 2022-12-30 DIAGNOSIS — R5383 Other fatigue: Secondary | ICD-10-CM

## 2022-12-30 DIAGNOSIS — Z114 Encounter for screening for human immunodeficiency virus [HIV]: Secondary | ICD-10-CM

## 2022-12-30 DIAGNOSIS — Z1159 Encounter for screening for other viral diseases: Secondary | ICD-10-CM

## 2022-12-30 DIAGNOSIS — E538 Deficiency of other specified B group vitamins: Secondary | ICD-10-CM | POA: Diagnosis not present

## 2022-12-30 LAB — COMPREHENSIVE METABOLIC PANEL
ALT: 25 U/L (ref 0–35)
AST: 21 U/L (ref 0–37)
Albumin: 4.4 g/dL (ref 3.5–5.2)
Alkaline Phosphatase: 72 U/L (ref 39–117)
BUN: 13 mg/dL (ref 6–23)
CO2: 29 meq/L (ref 19–32)
Calcium: 9.8 mg/dL (ref 8.4–10.5)
Chloride: 103 meq/L (ref 96–112)
Creatinine, Ser: 0.64 mg/dL (ref 0.40–1.20)
GFR: 95.75 mL/min (ref >60.00)
Glucose, Bld: 85 mg/dL (ref 70–99)
Potassium: 4.6 meq/L (ref 3.5–5.1)
Sodium: 139 meq/L (ref 135–145)
Total Bilirubin: 0.7 mg/dL (ref 0.2–1.2)
Total Protein: 6.6 g/dL (ref 6.0–8.3)

## 2022-12-30 LAB — LIPID PANEL
Cholesterol: 228 mg/dL — ABNORMAL HIGH (ref 0–200)
HDL: 88 mg/dL (ref >39.00)
LDL Cholesterol: 126 mg/dL — ABNORMAL HIGH (ref 0–99)
NonHDL: 140.33
Total CHOL/HDL Ratio: 3
Triglycerides: 73 mg/dL (ref 0.0–149.0)
VLDL: 14.6 mg/dL (ref 0.0–40.0)

## 2022-12-30 LAB — CBC WITH DIFFERENTIAL/PLATELET
Basophils Absolute: 0 10*3/uL (ref 0.0–0.1)
Basophils Relative: 1 % (ref 0.0–3.0)
Eosinophils Absolute: 0.1 10*3/uL (ref 0.0–0.7)
Eosinophils Relative: 2.7 % (ref 0.0–5.0)
HCT: 42.9 % (ref 36.0–46.0)
Hemoglobin: 14.2 g/dL (ref 12.0–15.0)
Lymphocytes Relative: 21.6 % (ref 12.0–46.0)
Lymphs Abs: 1 10*3/uL (ref 0.7–4.0)
MCHC: 33.1 g/dL (ref 30.0–36.0)
MCV: 92.8 fL (ref 78.0–100.0)
Monocytes Absolute: 0.5 10*3/uL (ref 0.1–1.0)
Monocytes Relative: 10.1 % (ref 3.0–12.0)
Neutro Abs: 3 10*3/uL (ref 1.4–7.7)
Neutrophils Relative %: 64.6 % (ref 43.0–77.0)
Platelets: 317 10*3/uL (ref 150.0–400.0)
RBC: 4.63 Mil/uL (ref 3.87–5.11)
RDW: 12.6 % (ref 11.5–15.5)
WBC: 4.6 10*3/uL (ref 4.0–10.5)

## 2022-12-30 LAB — VITAMIN B12: Vitamin B-12: >1501 pg/mL — ABNORMAL HIGH (ref 211–911)

## 2022-12-30 LAB — TSH: TSH: 4.56 u[IU]/mL (ref 0.35–5.50)

## 2022-12-30 LAB — VITAMIN D 25 HYDROXY (VIT D DEFICIENCY, FRACTURES): VITD: 65.78 ng/mL (ref 30.00–100.00)

## 2022-12-30 LAB — HEMOGLOBIN A1C: Hgb A1c MFr Bld: 5.7 % (ref 4.6–6.5)

## 2022-12-31 LAB — HIV ANTIBODY (ROUTINE TESTING W REFLEX): HIV 1&2 Ab, 4th Generation: NONREACTIVE

## 2022-12-31 LAB — HEPATITIS C ANTIBODY: Hepatitis C Ab: NONREACTIVE

## 2023-01-01 ENCOUNTER — Encounter: Payer: Self-pay | Admitting: Family Medicine

## 2023-01-01 DIAGNOSIS — Z114 Encounter for screening for human immunodeficiency virus [HIV]: Secondary | ICD-10-CM | POA: Insufficient documentation

## 2023-01-01 DIAGNOSIS — R413 Other amnesia: Secondary | ICD-10-CM

## 2023-01-01 DIAGNOSIS — R7309 Other abnormal glucose: Secondary | ICD-10-CM | POA: Insufficient documentation

## 2023-01-01 DIAGNOSIS — Z1159 Encounter for screening for other viral diseases: Secondary | ICD-10-CM | POA: Insufficient documentation

## 2023-01-01 DIAGNOSIS — Z1231 Encounter for screening mammogram for malignant neoplasm of breast: Secondary | ICD-10-CM | POA: Insufficient documentation

## 2023-01-01 DIAGNOSIS — H524 Presbyopia: Secondary | ICD-10-CM | POA: Insufficient documentation

## 2023-01-01 DIAGNOSIS — E538 Deficiency of other specified B group vitamins: Secondary | ICD-10-CM | POA: Insufficient documentation

## 2023-01-01 HISTORY — DX: Other amnesia: R41.3

## 2023-01-01 NOTE — Assessment & Plan Note (Addendum)
Possibly related to new employment. No clear etiology identified during the visit. Chart review appears has has history of fatigue and myalgia dated back to 2019. -Order comprehensive blood work including CBC, CMP, TSH, Vitamin D, and B12 levels. -Schedule follow-up appointment in 2 weeks to discuss results.

## 2023-01-01 NOTE — Assessment & Plan Note (Signed)
Worsening symptoms, no further details provided during the visit. -Kegel exercises frequently -Consider Pelvic Floor therapy -Consider referral to urology if symptoms persist or worsen.

## 2023-01-01 NOTE — Assessment & Plan Note (Signed)
-  Consider eye exam with optometrist or ophthalmologist for reported eye discomfort and blurriness.

## 2023-01-01 NOTE — Assessment & Plan Note (Signed)
Possibly related to new employment. No clear etiology identified during the visit. Chart review appears has has history of fatigue and myalgia dated back to 2019. -Order comprehensive blood work including CBC, CMP, TSH, Vitamin D, and B12 levels. -Schedule follow-up appointment in 2 weeks to discuss results.

## 2023-01-01 NOTE — Assessment & Plan Note (Signed)
Well-controlled on Hydrochlorothiazide 12.5mg  daily. -Continue Hydrochlorothiazide 12.5mg  daily. -Check electrolytes with blood work to assess for any imbalance.

## 2023-01-01 NOTE — Assessment & Plan Note (Addendum)
Patient reports difficulty with name recall and concerns about potential memory loss. Family history of dementia and Alzheimer's disease. -Consider referral to neurology if memory concerns persist or worsen.

## 2023-01-01 NOTE — Assessment & Plan Note (Signed)
Check fasting lipids 

## 2023-01-01 NOTE — Assessment & Plan Note (Signed)
-  Continue Estradiol as prescribed by OB/GYN.

## 2023-01-01 NOTE — Assessment & Plan Note (Signed)
-  Ensure mammogram and Pap smear are completed as scheduled with OB/GYN -Patient reports has mammogram scheduled in [place].

## 2023-01-05 LAB — HM MAMMOGRAPHY

## 2023-01-07 ENCOUNTER — Ambulatory Visit: Payer: Commercial Managed Care - PPO | Admitting: Family Medicine

## 2023-01-07 ENCOUNTER — Encounter: Payer: Self-pay | Admitting: Family Medicine

## 2023-01-07 VITALS — BP 134/74 | HR 61 | Temp 97.7°F | Ht 63.0 in | Wt 130.8 lb

## 2023-01-07 DIAGNOSIS — E538 Deficiency of other specified B group vitamins: Secondary | ICD-10-CM | POA: Diagnosis not present

## 2023-01-07 DIAGNOSIS — R7303 Prediabetes: Secondary | ICD-10-CM | POA: Diagnosis not present

## 2023-01-07 DIAGNOSIS — E785 Hyperlipidemia, unspecified: Secondary | ICD-10-CM | POA: Diagnosis not present

## 2023-01-07 DIAGNOSIS — Z23 Encounter for immunization: Secondary | ICD-10-CM

## 2023-01-07 DIAGNOSIS — R0789 Other chest pain: Secondary | ICD-10-CM | POA: Diagnosis not present

## 2023-01-07 NOTE — Progress Notes (Unsigned)
SUBJECTIVE:   Chief Complaint  Patient presents with   Medical Management of Chronic Issues   HPI Presents to clinic for follow up chronic disease management  Discussed the use of AI scribe software for clinical note transcription with the patient, who gave verbal consent to proceed.  History of Present Illness The patient, with a history of elevated cholesterol and vitamin B12 levels, presented for a follow-up visit to discuss recent blood work results. They reported that their cholesterol was high at 228, with their LDL cholesterol also elevated at 126. Their triglycerides were within normal limits at 73.  The patient's HbA1c was reported to be 5.7, indicating a prediabetic state. They were advised to manage this through diet and exercise. All other blood work, including kidney and liver function tests, were reported to be within normal limits.  The patient also reported a new onset of chest pain, which started after a recent exercise session this morning. They described the pain as being severe enough to cause difficulty in breathing. The pain was localized to the area where they had been leaning on the treadmill during their exercise. They had taken naproxen for the pain, but reported no relief. The patient denied any associated fever or cough.  In addition, the patient reported a history of lower back pain and difficulty sleeping on their side due to nasal congestion. They also mentioned a recent mammogram, the results of which were not discussed during the visit. The patient denied taking any cholesterol medication, expressing a preference for managing their health through diet and exercise.    PERTINENT PMH / PSH: As above  OBJECTIVE:  BP 134/74   Pulse 61   Temp 97.7 F (36.5 C) (Oral)   Ht 5\' 3"  (1.6 m)   Wt 130 lb 12.8 oz (59.3 kg)   SpO2 99%   BMI 23.17 kg/m    Physical Exam Vitals reviewed.  Constitutional:      General: She is not in acute distress.     Appearance: Normal appearance. She is normal weight. She is not ill-appearing, toxic-appearing or diaphoretic.  Eyes:     General:        Right eye: No discharge.        Left eye: No discharge.     Conjunctiva/sclera: Conjunctivae normal.  Cardiovascular:     Rate and Rhythm: Normal rate and regular rhythm.     Heart sounds: Normal heart sounds.  Pulmonary:     Effort: Pulmonary effort is normal.     Breath sounds: Normal breath sounds.  Chest:     Chest wall: Tenderness present. No crepitus or edema.    Abdominal:     General: Bowel sounds are normal.  Musculoskeletal:        General: Normal range of motion.  Skin:    General: Skin is warm and dry.  Neurological:     General: No focal deficit present.     Mental Status: She is alert and oriented to person, place, and time. Mental status is at baseline.  Psychiatric:        Mood and Affect: Mood normal.        Behavior: Behavior normal.        Thought Content: Thought content normal.        Judgment: Judgment normal.        12/24/2022    8:28 AM 10/16/2021    3:08 PM 08/13/2021    8:27 AM 02/05/2020    2:50 PM 08/01/2018  10:54 AM  Depression screen PHQ 2/9  Decreased Interest 2 0 1 0 0  Down, Depressed, Hopeless 0 0 0 0 0  PHQ - 2 Score 2 0 1 0 0  Altered sleeping 2      Tired, decreased energy 3      Change in appetite 0      Feeling bad or failure about yourself  0      Trouble concentrating 0      Moving slowly or fidgety/restless 0      Suicidal thoughts 0      PHQ-9 Score 7      Difficult doing work/chores Not difficult at all          12/24/2022    8:28 AM  GAD 7 : Generalized Anxiety Score  Nervous, Anxious, on Edge 3  Control/stop worrying 2  Worry too much - different things 2  Trouble relaxing 2  Restless 0  Easily annoyed or irritable 1  Afraid - awful might happen 0  Total GAD 7 Score 10  Anxiety Difficulty Not difficult at all    ASSESSMENT/PLAN:  Chest wall pain Assessment &  Plan: Acute onset of chest wall pain after exercise. Likely musculoskeletal due to reported mechanism of injury. No respiratory symptoms. Pain is reproducible on palpation. -Continue Naproxen as needed. -Consider adding Tylenol for pain relief. -Consider using Diclofenac gel. -Avoid repetitive motion and heavy lifting. -Contact office if pain worsens or does not improve in a week.   Need for influenza vaccination -     Flu vaccine trivalent PF, 6mos and older(Flulaval,Afluria,Fluarix,Fluzone)  Hyperlipidemia, unspecified hyperlipidemia type Assessment & Plan: Total cholesterol and LDL elevated. ASCVD risk calculated at 3.5% (low risk). Discussed lifestyle modifications including diet and exercise. -Continue lifestyle modifications. -Recheck lipid panel next year.   Prediabetes Assessment & Plan: A1c at 5.7. Discussed lifestyle modifications including diet and exercise. -Continue lifestyle modifications.   Vitamin B 12 deficiency Assessment & Plan: Elevated levels. Patient advised to decrease intake. -Decrease Vitamin B12 intake to twice a day.    General Health Maintenance -Administer influenza vaccine today. -Obtain mammogram results from [facility].    PDMP reviewed  Return if symptoms worsen or fail to improve, for PCP.  Dana Allan, MD

## 2023-01-07 NOTE — Patient Instructions (Addendum)
It was a pleasure meeting you today. Thank you for allowing me to take part in your health care.  Our goals for today as we discussed include:  Flu vaccine today  Blood pressure was better after repeat.  This is a list of the screening recommended for you and due dates:  Health Maintenance  Topic Date Due   DTaP/Tdap/Td vaccine (1 - Tdap) Never done   Mammogram  12/05/2021   COVID-19 Vaccine (5 - 2023-24 season) 11/21/2022   Pap with HPV screening  12/06/2022   Flu Shot  06/20/2023*   Colon Cancer Screening  11/17/2026   Hepatitis C Screening  Completed   HIV Screening  Completed   Zoster (Shingles) Vaccine  Completed   HPV Vaccine  Aged Out  *Topic was postponed. The date shown is not the original due date.    Follow up as needed  If you have any questions or concerns, please do not hesitate to call the office at [phone].  I look forward to our next visit and until then take care and stay safe.  Regards,   Dana Allan, MD   [facility]

## 2023-01-09 DIAGNOSIS — R7303 Prediabetes: Secondary | ICD-10-CM | POA: Insufficient documentation

## 2023-01-09 DIAGNOSIS — R0789 Other chest pain: Secondary | ICD-10-CM

## 2023-01-09 DIAGNOSIS — Z23 Encounter for immunization: Secondary | ICD-10-CM | POA: Insufficient documentation

## 2023-01-09 HISTORY — DX: Other chest pain: R07.89

## 2023-01-09 NOTE — Assessment & Plan Note (Signed)
A1c at 5.7. Discussed lifestyle modifications including diet and exercise. -Continue lifestyle modifications.

## 2023-01-09 NOTE — Assessment & Plan Note (Signed)
Total cholesterol and LDL elevated. ASCVD risk calculated at 3.5% (low risk). Discussed lifestyle modifications including diet and exercise. -Continue lifestyle modifications. -Recheck lipid panel next year.

## 2023-01-09 NOTE — Assessment & Plan Note (Signed)
Elevated levels. Patient advised to decrease intake. -Decrease Vitamin B12 intake to twice a day.

## 2023-01-09 NOTE — Assessment & Plan Note (Signed)
Acute onset of chest wall pain after exercise. Likely musculoskeletal due to reported mechanism of injury. No respiratory symptoms. Pain is reproducible on palpation. -Continue Naproxen as needed. -Consider adding Tylenol for pain relief. -Consider using Diclofenac gel. -Avoid repetitive motion and heavy lifting. -Contact office if pain worsens or does not improve in a week.

## 2023-01-11 LAB — HM PAP SMEAR: HPV, high-risk: POSITIVE

## 2023-01-31 ENCOUNTER — Other Ambulatory Visit: Payer: Self-pay | Admitting: Family Medicine

## 2023-01-31 DIAGNOSIS — I10 Essential (primary) hypertension: Secondary | ICD-10-CM

## 2023-03-01 ENCOUNTER — Telehealth: Payer: Self-pay | Admitting: Family Medicine

## 2023-03-01 ENCOUNTER — Ambulatory Visit: Payer: Commercial Managed Care - PPO | Admitting: Family Medicine

## 2023-03-01 ENCOUNTER — Encounter: Payer: Self-pay | Admitting: Family Medicine

## 2023-03-01 VITALS — BP 125/85 | HR 81 | Temp 98.2°F | Resp 18 | Ht 63.0 in | Wt 131.1 lb

## 2023-03-01 DIAGNOSIS — H524 Presbyopia: Secondary | ICD-10-CM | POA: Diagnosis not present

## 2023-03-01 DIAGNOSIS — L819 Disorder of pigmentation, unspecified: Secondary | ICD-10-CM | POA: Diagnosis not present

## 2023-03-01 MED ORDER — ASPIRIN 81 MG PO TBEC: 81.0000 mg | DELAYED_RELEASE_TABLET | Freq: Every day | ORAL | Status: DC

## 2023-03-01 MED ORDER — CLOPIDOGREL BISULFATE 75 MG PO TABS: 75.0000 mg | ORAL_TABLET | Freq: Every day | ORAL | 0 refills | Status: DC

## 2023-03-01 NOTE — Telephone Encounter (Signed)
Pt seen in office 03/01/2023 @ 2 pm

## 2023-03-01 NOTE — Patient Instructions (Addendum)
It was a pleasure meeting you today. Thank you for allowing me to take part in your health care.  Our goals for today as we discussed include:  Stop Yuvafem  Start Plavix 75 mg daily and low dose Aspirin 81 mg daily until evaluated by Vascular surgery  Referral sent to [facility]. They will call to schedule an appointment  If any worsening symptoms please go to the emergency department.  This is a list of the screening recommended for you and due dates:  Health Maintenance  Topic Date Due   DTaP/Tdap/Td vaccine (1 - Tdap) Never done   COVID-19 Vaccine (5 - 2023-24 season) 11/21/2022   Mammogram  01/04/2025   Colon Cancer Screening  11/17/2026   Pap with HPV screening  01/11/2028   Flu Shot  Completed   Hepatitis C Screening  Completed   HIV Screening  Completed   Zoster (Shingles) Vaccine  Completed   HPV Vaccine  Aged Out     Follow up as needed  If you have any questions or concerns, please do not hesitate to call the office at [phone].  I look forward to our next visit and until then take care and stay safe.  Regards,   Dana Allan, MD   [facility]

## 2023-03-01 NOTE — Progress Notes (Signed)
SUBJECTIVE:   Chief Complaint  Patient presents with   Hand Pain    Right Middle finger X yesterday. Started in the middle and now it the whole finger. Was stiff but used other hand to move it.    HPI Presents to clinic for acute visit Discussed the use of AI scribe software for clinical note transcription with the patient, who gave verbal consent to proceed.  History of Present Illness The patient, with an unspecified medical history, presents with a sudden onset of discoloration and loss of movement in her right middle finger. The patient noticed the symptoms after a light 20-minute workout on the treadmill. The finger turned black from the base to the middle joint and was sore to touch. The patient also noticed the tip of the finger turning white. The patient experienced a similar, albeit less severe, episode in the past involving a different finger, which resolved spontaneously within 30 minutes.  The patient also reported a sensation of numbness and tingling in the affected finger, likening it to the feeling of a hand warming up after exposure to cold. The patient denied any recent injuries, fevers, or changes in medication. The patient also reported feeling tired and lacking energy on the day of the incident, with no other associated symptoms such as chest pain, shortness of breath, nausea, vomiting, diarrhea, urinary symptoms, weakness, or visual changes.  In the past month, the patient has also noticed the occasional appearance of black floaters in her vision, lasting for about a minute at a time. The patient has not yet sought an eye examination for this symptom.    PERTINENT PMH / PSH: As above  OBJECTIVE:  BP 125/85   Pulse 81   Temp 98.2 F (36.8 C)   Resp 18   Ht 5\' 3"  (1.6 m)   Wt 131 lb 2 oz (59.5 kg)   SpO2 97%   BMI 23.23 kg/m    Physical Exam Constitutional:      General: She is not in acute distress.    Appearance: She is not ill-appearing or  toxic-appearing.  Cardiovascular:     Pulses: Normal pulses.  Musculoskeletal:     Right hand: Normal capillary refill. Normal pulse.     Left hand: Normal capillary refill. Normal pulse.     Comments: Decreased sensation right middle finger  Neurological:     Mental Status: She is alert.             03/01/2023    3:43 PM 12/24/2022    8:28 AM 10/16/2021    3:08 PM 08/13/2021    8:27 AM 02/05/2020    2:50 PM  Depression screen PHQ 2/9  Decreased Interest 0 2 0 1 0  Down, Depressed, Hopeless 0 0 0 0 0  PHQ - 2 Score 0 2 0 1 0  Altered sleeping 2 2     Tired, decreased energy 2 3     Change in appetite 0 0     Feeling bad or failure about yourself  0 0     Trouble concentrating 0 0     Moving slowly or fidgety/restless 0 0     Suicidal thoughts 0 0     PHQ-9 Score 4 7     Difficult doing work/chores Not difficult at all Not difficult at all         03/01/2023    3:43 PM 12/24/2022    8:28 AM  GAD 7 : Generalized Anxiety Score  Nervous, Anxious, on Edge 1 3  Control/stop worrying 1 2  Worry too much - different things 1 2  Trouble relaxing 0 2  Restless 0 0  Easily annoyed or irritable 0 1  Afraid - awful might happen 0 0  Total GAD 7 Score 3 10  Anxiety Difficulty Not difficult at all Not difficult at all    ASSESSMENT/PLAN:  Discoloration of skin of finger Assessment & Plan: Sudden onset of pain, discoloration, and loss of movement in the right middle finger following mild exercise. No history of injury or exposure to cold air. Symptoms improved with time and movement.  Cannot rule out microemboli given acuteness of event. -Spoke with NP Vivia Birmingham  and agreed would see for further evaluation and management. Recommend Plavix and ASA.  Scheduled appointment for evaluation and imaging tomorrow with vascular. -Start Plavix 75 mg daily and ASA 81 mg daily -Referral sent to Vascular for evaluation.  Appreciate recommendations.  Orders: -     Ambulatory referral to  Vascular Surgery -     CBC with Differential/Platelet -     Comprehensive metabolic panel -     Clopidogrel Bisulfate; Take 1 tablet (75 mg total) by mouth daily.  Dispense: 30 tablet; Refill: 0 -     Aspirin; Take 1 tablet (81 mg total) by mouth daily. Swallow whole. -     Protime-INR; Future  Presbyopia Assessment & Plan: Recent onset of transient black floaters in vision, lasting for about a minute. No other associated symptoms. -Schedule an ophthalmology appointment for a comprehensive eye examination.     PDMP reviewed  Return if symptoms worsen or fail to improve, for PCP.  Dana Allan, MD

## 2023-03-01 NOTE — Telephone Encounter (Signed)
  Pt has an appointment with Dr. Clent Ridges today.

## 2023-03-01 NOTE — Telephone Encounter (Signed)
  Symptoms:  Yesterday 02/28/23 Lost all mobility in middle finger and it turned purple patient states.  Patient sent to nurse triage   Attributing factors (medication changes, positional changes, etc. )      Duration :     Pain Scale?  On 1-10 how woiuld you rate your pain? What makes it better or worse?      Blood pressure            Pulse             Temp

## 2023-03-02 ENCOUNTER — Other Ambulatory Visit (INDEPENDENT_AMBULATORY_CARE_PROVIDER_SITE_OTHER): Payer: Self-pay | Admitting: Nurse Practitioner

## 2023-03-02 ENCOUNTER — Ambulatory Visit (INDEPENDENT_AMBULATORY_CARE_PROVIDER_SITE_OTHER): Payer: Commercial Managed Care - PPO

## 2023-03-02 ENCOUNTER — Encounter (INDEPENDENT_AMBULATORY_CARE_PROVIDER_SITE_OTHER): Payer: Self-pay | Admitting: Nurse Practitioner

## 2023-03-02 ENCOUNTER — Ambulatory Visit (INDEPENDENT_AMBULATORY_CARE_PROVIDER_SITE_OTHER): Payer: Commercial Managed Care - PPO | Admitting: Nurse Practitioner

## 2023-03-02 ENCOUNTER — Ambulatory Visit: Payer: Commercial Managed Care - PPO | Admitting: Nurse Practitioner

## 2023-03-02 ENCOUNTER — Encounter: Payer: Self-pay | Admitting: Family Medicine

## 2023-03-02 VITALS — BP 124/80 | HR 64 | Resp 16 | Ht 63.0 in | Wt 131.8 lb

## 2023-03-02 DIAGNOSIS — I742 Embolism and thrombosis of arteries of the upper extremities: Secondary | ICD-10-CM | POA: Diagnosis not present

## 2023-03-02 DIAGNOSIS — L819 Disorder of pigmentation, unspecified: Secondary | ICD-10-CM

## 2023-03-02 DIAGNOSIS — I10 Essential (primary) hypertension: Secondary | ICD-10-CM | POA: Diagnosis not present

## 2023-03-02 LAB — CBC WITH DIFFERENTIAL/PLATELET
Basophils Absolute: 0.1 10*3/uL (ref 0.0–0.1)
Basophils Relative: 1 % (ref 0.0–3.0)
Eosinophils Absolute: 0.2 10*3/uL (ref 0.0–0.7)
Eosinophils Relative: 2.2 % (ref 0.0–5.0)
HCT: 41.8 % (ref 36.0–46.0)
Hemoglobin: 14.1 g/dL (ref 12.0–15.0)
Lymphocytes Relative: 16.5 % (ref 12.0–46.0)
Lymphs Abs: 1.3 10*3/uL (ref 0.7–4.0)
MCHC: 33.6 g/dL (ref 30.0–36.0)
MCV: 93.9 fL (ref 78.0–100.0)
Monocytes Absolute: 0.7 10*3/uL (ref 0.1–1.0)
Monocytes Relative: 8.7 % (ref 3.0–12.0)
Neutro Abs: 5.8 10*3/uL (ref 1.4–7.7)
Neutrophils Relative %: 71.6 % (ref 43.0–77.0)
Platelets: 329 10*3/uL (ref 150.0–400.0)
RBC: 4.45 Mil/uL (ref 3.87–5.11)
RDW: 13 % (ref 11.5–15.5)
WBC: 8.1 10*3/uL (ref 4.0–10.5)

## 2023-03-02 LAB — COMPREHENSIVE METABOLIC PANEL
ALT: 26 U/L (ref 0–35)
AST: 23 U/L (ref 0–37)
Albumin: 4.4 g/dL (ref 3.5–5.2)
Alkaline Phosphatase: 85 U/L (ref 39–117)
BUN: 26 mg/dL — ABNORMAL HIGH (ref 6–23)
CO2: 28 meq/L (ref 19–32)
Calcium: 9.4 mg/dL (ref 8.4–10.5)
Chloride: 101 meq/L (ref 96–112)
Creatinine, Ser: 0.64 mg/dL (ref 0.40–1.20)
GFR: 95.64 mL/min (ref >60.00)
Glucose, Bld: 93 mg/dL (ref 70–99)
Potassium: 3.9 meq/L (ref 3.5–5.1)
Sodium: 137 meq/L (ref 135–145)
Total Bilirubin: 0.4 mg/dL (ref 0.2–1.2)
Total Protein: 7 g/dL (ref 6.0–8.3)

## 2023-03-02 NOTE — Progress Notes (Signed)
Subjective:    Patient ID: Kathy Riley, female    DOB: 01-13-1961, 62 y.o.   MRN: [id number] Chief Complaint  Patient presents with   New Patient (Initial Visit)    Ref Clent Ridges consult discoloration of finger    Kathy Riley is a 62 year old female who presents today as a referral from Dr. Clent Ridges in regards to discoloration of the right middle finger.  This occurred relatively suddenly.  She woke up and was feeling somewhat poorly that day and noticed that her right middle finger had turned black.  After a day or so the discoloration decreased and is much improved but he can tell there is still some slight discoloration.  The fingernail was small and it was not particularly painful.  Today she has good capillary refill and easily palpable pulses.  She still has some altered sensation in the finger as well.  She notes that this has happened previously on her right hand as well but it was her ring finger affected and it actually resolved much faster.  She denies any chest pain, shortness of breath or palpitations.  Currently the patient does not have any diagnosis of atrial fibrillation but she does have a brother that was recently diagnosed.  Today she underwent noninvasive studies which shows no significant arterial occlusion in the right upper extremity.  This is evaluated up and through the subclavian vessels.  The finger waveforms on the right upper extremity are slightly diminished when compared to the left    Review of Systems  Constitutional:  Positive for fatigue.  Skin:  Positive for color change.  All other systems reviewed and are negative.      Objective:   Physical Exam Vitals reviewed.  HENT:     Head: Normocephalic.  Cardiovascular:     Rate and Rhythm: Normal rate and regular rhythm.     Pulses: Normal pulses.  Pulmonary:     Effort: Pulmonary effort is normal.  Skin:    General: Skin is warm and dry.  Neurological:     Mental Status: She is alert and  oriented to person, place, and time.  Psychiatric:        Mood and Affect: Mood normal.        Behavior: Behavior normal.        Thought Content: Thought content normal.        Judgment: Judgment normal.        BP 124/80 (BP Location: Left Arm)   Pulse 64   Resp 16   Ht 5\' 3"  (1.6 m)   Wt 131 lb 12.8 oz (59.8 kg)   BMI 23.35 kg/m   Past Medical History:  Diagnosis Date   Allergy    Arthritis    HANDS   Bacterial vaginosis    Cervicalgia    Chicken pox    Colon polyps    COVID-19    09/16/20   Diverticulitis    Fibroid    GERD (gastroesophageal reflux disease)    Hyperlipidemia    03/17/17 TC 253, HDL 115 per review of notes   Hypertension    Irregular menses    Kidney stones    Mid back pain    Osteoporosis    Ovarian cyst    UTI (urinary tract infection)     Social History   Socioeconomic History   Marital status: Divorced    Spouse name: Not on file   Number of children: 0   Years of education: 80  Highest education level: Not on file  Occupational History   Occupation: Adult nurse  Tobacco Use   Smoking status: Never    Passive exposure: Never   Smokeless tobacco: Never  Vaping Use   Vaping status: Never Used  Substance and Sexual Activity   Alcohol use: Yes    Comment: twice A DAY   Drug use: No   Sexual activity: Yes    Birth control/protection: None  Other Topics Concern   Not on file  Social History Narrative   Fun: Relax, swim, exercise    Denies religious beliefs effecting health care.   Denies abuse and feels safe at home.    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      Never smoker/chew   Wine 1-2 glasses qhs    No illegal drugs    Social Determinants of Health   Financial Resource Strain: Not on file  Food Insecurity: Not on file  Transportation Needs: Not on file  Physical Activity: Not on file  Stress: Not on file  Social Connections: Not on file  Intimate Partner Violence: Not on file    Past Surgical  History:  Procedure Laterality Date   2008 fibroid surgery      laparoscopy    APPENDECTOMY     BREAST SURGERY     right breast benign bx   COLONOSCOPY     COLONOSCOPY W/ POLYPECTOMY     2014    DILATION AND CURETTAGE OF UTERUS     x 3 2/2 miscarriages    TONSILLECTOMY     WISDOM TOOTH EXTRACTION      Family History  Problem Relation Age of Onset   Hypertension Mother    Dementia Mother        61/62   Prostate cancer Father    Alcohol abuse Father    Hypertension Father    Colon cancer Father        30   Colon polyps Brother    Stroke Brother    Stroke Brother    Dementia Maternal Aunt    Cancer - Ovarian Maternal Aunt    Stroke Maternal Grandmother    Hypertension Maternal Grandmother    Hypertension Maternal Grandfather    Hypertension Paternal Grandmother    Stroke Paternal Grandfather    Hypertension Paternal Grandfather    Colon cancer Other        60-70   Crohn's disease Neg Hx    Esophageal cancer Neg Hx    Rectal cancer Neg Hx    Stomach cancer Neg Hx     Allergies  Allergen Reactions   Ibuprofen Other (See Comments)   Penicillins     Fever/hives, happened as a child        Latest Ref Rng & Units 03/01/2023    3:40 PM 12/30/2022    7:22 AM 08/13/2021    9:37 AM  CBC  WBC 4.0 - 10.5 K/uL 8.1  4.6  5.3   Hemoglobin 12.0 - 15.0 g/dL 16.1  09.6  04.5   Hematocrit 36.0 - 46.0 % 41.8  42.9  41.8   Platelets 150.0 - 400.0 K/uL 329.0  317.0  298.0       CMP     Component Value Date/Time   NA 137 03/01/2023 1540   NA 138 07/30/2013 0510   K 3.9 03/01/2023 1540   K 3.9 07/30/2013 0510   CL 101 03/01/2023 1540   CL 104 07/30/2013 0510   CO2 28 03/01/2023 1540  CO2 28 07/30/2013 0510   GLUCOSE 93 03/01/2023 1540   GLUCOSE 92 07/30/2013 0510   BUN 26 (H) 03/01/2023 1540   BUN 18 07/30/2013 0510   CREATININE 0.64 03/01/2023 1540   CREATININE 0.56 (L) 07/30/2013 0510   CALCIUM 9.4 03/01/2023 1540   CALCIUM 9.2 07/30/2013 0510   PROT 7.0  03/01/2023 1540   ALBUMIN 4.4 03/01/2023 1540   AST 23 03/01/2023 1540   ALT 26 03/01/2023 1540   ALKPHOS 85 03/01/2023 1540   BILITOT 0.4 03/01/2023 1540   GFR 95.64 03/01/2023 1540   GFRNONAA >60 07/30/2013 0510     No results found.     Assessment & Plan:   1. 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  Today her noninvasive studies show no acute arterial occlusion in her right upper extremity or into the subclavian vessels.  I do not feel that she has Raynaud's disease as this will typically affect all of her fingertips and it does not follow the typical distribution pattern.  Given there is concern for micro emboli it is, felt that this may be paroxysmal atrial fibrillation, or delete a possible thoracic aortic aneurysm in addition to possible atherosclerotic ulcerated plaque in the aorta as well.  Although a little less likely, this could be a vasospasm.  Due to this concern we will have the patient undergo a CT angio aorta in order to evaluate for these possibilities.  Depending on the results of the CT scan, we can consider possible referral to cardiology for evaluation of atrial fibrillation.  In the interim we have had the patient to begin on Plavix and aspirin. - CT ANGIO CHEST AORTA W/CM & OR WO/CM; Future  2. Essential hypertension Continue antihypertensive medications as already ordered, these medications have been reviewed and there are no changes at this time.   Current Outpatient Medications on File Prior to Visit  Medication Sig Dispense Refill   aspirin EC 81 MG tablet Take 1 tablet (81 mg total) by mouth daily. Swallow whole.     Cholecalciferol (VITAMIN D3 PO) Take 10,000 Units by mouth daily.      clopidogrel (PLAVIX) 75 MG tablet Take 1 tablet (75 mg total) by mouth daily. 30 tablet 0   fexofenadine (ALLEGRA) 180 MG tablet  Take 180 mg by mouth at bedtime as needed for allergies or rhinitis.     fluocinonide cream (LIDEX) 0.05 % Apply 1 Application topically 2 (two) times daily. Poison ivy, bug bites not for face/groin/underarms 30 g 2   hydrochlorothiazide (MICROZIDE) 12.5 MG capsule TAKE 1 CAPSULE BY MOUTH EVERY DAY IN THE MORNING 90 capsule 1   YUVAFEM 10 MCG TABS vaginal tablet Place 1 tablet (10 mcg total) vaginally once a week. 9 tablet 2   No current facility-administered medications on file prior to visit.    There are no Patient Instructions on file for this visit. No follow-ups on file.   Georgiana Spinner, NP

## 2023-03-03 ENCOUNTER — Encounter (INDEPENDENT_AMBULATORY_CARE_PROVIDER_SITE_OTHER): Payer: Self-pay | Admitting: Nurse Practitioner

## 2023-03-06 ENCOUNTER — Encounter: Payer: Self-pay | Admitting: Family Medicine

## 2023-03-06 DIAGNOSIS — L819 Disorder of pigmentation, unspecified: Secondary | ICD-10-CM

## 2023-03-06 HISTORY — DX: Disorder of pigmentation, unspecified: L81.9

## 2023-03-06 NOTE — Assessment & Plan Note (Signed)
Recent onset of transient black floaters in vision, lasting for about a minute. No other associated symptoms. -Schedule an ophthalmology appointment for a comprehensive eye examination.

## 2023-03-06 NOTE — Assessment & Plan Note (Signed)
Sudden onset of pain, discoloration, and loss of movement in the right middle finger following mild exercise. No history of injury or exposure to cold air. Symptoms improved with time and movement.  Cannot rule out microemboli given acuteness of event. -Spoke with NP Vivia Birmingham  and agreed would see for further evaluation and management. Recommend Plavix and ASA.  Scheduled appointment for evaluation and imaging tomorrow with vascular. -Start Plavix 75 mg daily and ASA 81 mg daily -Referral sent to Vascular for evaluation.  Appreciate recommendations.

## 2023-03-08 ENCOUNTER — Telehealth (INDEPENDENT_AMBULATORY_CARE_PROVIDER_SITE_OTHER): Payer: Self-pay

## 2023-03-08 NOTE — Telephone Encounter (Signed)
Patient left a message asking what was the purpose for the CT. I informed the patient the CT is order to do further evaluation per last note. Patient verbalized understanding.

## 2023-03-24 ENCOUNTER — Other Ambulatory Visit: Payer: Self-pay | Admitting: Family Medicine

## 2023-03-24 DIAGNOSIS — L819 Disorder of pigmentation, unspecified: Secondary | ICD-10-CM

## 2023-04-06 ENCOUNTER — Ambulatory Visit
Admission: RE | Admit: 2023-04-06 | Discharge: 2023-04-06 | Disposition: A | Discharge status: Home / Self Care | Payer: Commercial Managed Care - PPO | Source: Ambulatory Visit | Attending: Nurse Practitioner | Admitting: Nurse Practitioner

## 2023-04-06 DIAGNOSIS — I742 Embolism and thrombosis of arteries of the upper extremities: Secondary | ICD-10-CM | POA: Diagnosis present

## 2023-04-06 MED ORDER — IOHEXOL 350 MG/ML SOLN
75.0000 mL | Freq: Once | INTRAVENOUS | Status: AC | PRN
  Administered 2023-04-06: 75 mL via INTRAVENOUS

## 2023-04-11 ENCOUNTER — Telehealth (INDEPENDENT_AMBULATORY_CARE_PROVIDER_SITE_OTHER): Payer: Self-pay | Admitting: Vascular Surgery

## 2023-04-11 NOTE — Telephone Encounter (Signed)
LVM for pt TCB and schedule CT results appt with Dr. Lucky Cowboy.

## 2023-04-11 NOTE — Progress Notes (Signed)
Patient should come in and see JD to review studies

## 2023-04-19 ENCOUNTER — Telehealth (INDEPENDENT_AMBULATORY_CARE_PROVIDER_SITE_OTHER): Payer: Self-pay | Admitting: Vascular Surgery

## 2023-04-19 NOTE — Telephone Encounter (Signed)
Spoke with patient and attempted to get an office visit with Dr. Wyn Quaker to read the CT results. Patient wanted to know how much it would cost and that she would "be very upset if she came in for a 5 minute appt. Where they told her everything looks good". Patient stated she will call back to schedule as she did not have her calendar with her.

## 2023-04-26 ENCOUNTER — Ambulatory Visit (INDEPENDENT_AMBULATORY_CARE_PROVIDER_SITE_OTHER): Payer: Commercial Managed Care - PPO | Admitting: Vascular Surgery

## 2023-04-26 VITALS — BP 130/90 | HR 70 | Resp 16 | Wt 131.6 lb

## 2023-04-26 DIAGNOSIS — I742 Embolism and thrombosis of arteries of the upper extremities: Secondary | ICD-10-CM | POA: Diagnosis not present

## 2023-04-26 DIAGNOSIS — I10 Essential (primary) hypertension: Secondary | ICD-10-CM

## 2023-04-26 HISTORY — DX: Embolism and thrombosis of arteries of the upper extremities: I74.2

## 2023-04-26 NOTE — Progress Notes (Signed)
 MRN : [id number]  Kathy  Riley is a 63 y.o. (1960/10/04) female who presents with chief complaint of  Chief Complaint  Patient presents with   Follow-up    Ct results  .  History of Present Illness: Patient returns today in follow up of embolization to her right third finger.  Her symptoms have resolved and she currently has no discoloration or pain in her hands.  No chest pain or shortness of breath.  She has undergone a CT angiogram of the chest which I have independently reviewed.  This demonstrates relatively normal arterial anatomy with no aneurysm, dissection, stenosis or thrombosis to explain distal embolization.  I reviewed the images with the patient today.   Current Outpatient Medications  Medication Sig Dispense Refill   aspirin  EC 81 MG tablet Take 1 tablet (81 mg total) by mouth daily. Swallow whole.     Cholecalciferol (VITAMIN D3 PO) Take 10,000 Units by mouth daily.      clopidogrel  (PLAVIX ) 75 MG tablet TAKE 1 TABLET BY MOUTH EVERY DAY 90 tablet 1   fexofenadine (ALLEGRA) 180 MG tablet Take 180 mg by mouth at bedtime as needed for allergies or rhinitis.     fluocinonide  cream (LIDEX ) 0.05 % Apply 1 Application topically 2 (two) times daily. Poison ivy, bug bites not for face/groin/underarms 30 g 2   hydrochlorothiazide  (MICROZIDE ) 12.5 MG capsule TAKE 1 CAPSULE BY MOUTH EVERY DAY IN THE MORNING 90 capsule 1   YUVAFEM  10 MCG TABS vaginal tablet Place 1 tablet (10 mcg total) vaginally once a week. 9 tablet 2   No current facility-administered medications for this visit.    Past Medical History:  Diagnosis Date   Allergy    Arthritis    HANDS   Bacterial vaginosis    Cervicalgia    Chicken pox    Colon polyps    COVID-19    09/16/20   Diverticulitis    Fibroid    GERD (gastroesophageal reflux disease)    Hyperlipidemia    03/17/17 TC 253, HDL 115 per review of notes   Hypertension    Irregular menses    Kidney stones    Mid back pain    Osteoporosis     Ovarian cyst    UTI (urinary tract infection)     Past Surgical History:  Procedure Laterality Date   2008 fibroid surgery      laparoscopy    APPENDECTOMY     BREAST SURGERY     right breast benign bx   COLONOSCOPY     COLONOSCOPY W/ POLYPECTOMY     2014    DILATION AND CURETTAGE OF UTERUS     x 3 2/2 miscarriages    TONSILLECTOMY     WISDOM TOOTH EXTRACTION       Social History   Tobacco Use   Smoking status: Never    Passive exposure: Never   Smokeless tobacco: Never  Vaping Use   Vaping status: Never Used  Substance Use Topics   Alcohol use: Yes    Comment: twice A DAY   Drug use: No      Family History  Problem Relation Age of Onset   Hypertension Mother    Dementia Mother        61/62   Prostate cancer Father    Alcohol abuse Father    Hypertension Father    Colon cancer Father        31   Colon polyps Brother  Stroke Brother    Stroke Brother    Dementia Maternal Aunt    Cancer - Ovarian Maternal Aunt    Stroke Maternal Grandmother    Hypertension Maternal Grandmother    Hypertension Maternal Grandfather    Hypertension Paternal Grandmother    Stroke Paternal Grandfather    Hypertension Paternal Grandfather    Colon cancer Other        60-70   Crohn's disease Neg Hx    Esophageal cancer Neg Hx    Rectal cancer Neg Hx    Stomach cancer Neg Hx      Allergies  Allergen Reactions   Ibuprofen Other (See Comments)   Penicillins     Fever/hives, happened as a child      REVIEW OF SYSTEMS (Negative unless checked)  Constitutional: [] Weight loss  [] Fever  [] Chills Cardiac: [] Chest pain   [] Chest pressure   [] Palpitations   [] Shortness of breath when laying flat   [] Shortness of breath at rest   [] Shortness of breath with exertion. Vascular:  [] Pain in legs with walking   [] Pain in legs at rest   [] Pain in legs when laying flat   [] Claudication   [] Pain in feet when walking  [] Pain in feet at rest  [] Pain in feet when laying flat    [] History of DVT   [] Phlebitis   [] Swelling in legs   [] Varicose veins   [] Non-healing ulcers Pulmonary:   [] Uses home oxygen   [] Productive cough   [] Hemoptysis   [] Wheeze  [] COPD   [] Asthma Neurologic:  [] Dizziness  [] Blackouts   [] Seizures   [] History of stroke   [] History of TIA  [] Aphasia   [] Temporary blindness   [] Dysphagia   [] Weakness or numbness in arms   [] Weakness or numbness in legs Musculoskeletal:  [x] Arthritis   [] Joint swelling   [] Joint pain   [] Low back pain Hematologic:  [] Easy bruising  [] Easy bleeding   [] Hypercoagulable state   [] Anemic   Gastrointestinal:  [] Blood in stool   [] Vomiting blood  [x] Gastroesophageal reflux/heartburn   [] Abdominal pain Genitourinary:  [] Chronic kidney disease   [] Difficult urination  [] Frequent urination  [] Burning with urination   [] Hematuria Skin:  [] Rashes   [] Ulcers   [] Wounds Psychological:  [] History of anxiety   []  History of major depression.  Physical Examination  BP (!) 130/90   Pulse 70   Resp 16   Wt 131 lb 9.6 oz (59.7 kg)   BMI 23.31 kg/m  Gen:  WD/WN, NAD. Appears younger than stated age. Head: [place]/AT, No temporalis wasting. Ear/Nose/Throat: Hearing grossly intact, nares w/o erythema or drainage Eyes: Conjunctiva clear. Sclera non-icteric Neck: Supple.  Trachea midline Pulmonary:  Good air movement, no use of accessory muscles.  Cardiac: RRR, no JVD Vascular:  Vessel Right Left  Radial Palpable Palpable               Musculoskeletal: M/S 5/5 throughout.  No deformity or atrophy. No edema. Neurologic: Sensation grossly intact in extremities.  Symmetrical.  Speech is fluent.  Psychiatric: Judgment intact, Mood & affect appropriate for pt's clinical situation. Dermatologic: No rashes or ulcers noted.  No cellulitis or open wounds.      Labs Recent Results (from the past 2160 hours)  CBC with Differential/Platelet     Status: None   Collection Time: 03/01/23  3:40 PM  Result Value Ref Range   WBC 8.1 4.0 -  10.5 K/uL   RBC 4.45 3.87 - 5.11 Mil/uL   Hemoglobin 14.1 12.0 -  15.0 g/dL   HCT 58.1 63.9 - 53.9 %   MCV 93.9 78.0 - 100.0 fl   MCHC 33.6 30.0 - 36.0 g/dL   RDW 86.9 88.4 - 84.4 %   Platelets 329.0 150.0 - 400.0 K/uL   Neutrophils Relative % 71.6 43.0 - 77.0 %   Lymphocytes Relative 16.5 12.0 - 46.0 %   Monocytes Relative 8.7 3.0 - 12.0 %   Eosinophils Relative 2.2 0.0 - 5.0 %   Basophils Relative 1.0 0.0 - 3.0 %   Neutro Abs 5.8 1.4 - 7.7 K/uL   Lymphs Abs 1.3 0.7 - 4.0 K/uL   Monocytes Absolute 0.7 0.1 - 1.0 K/uL   Eosinophils Absolute 0.2 0.0 - 0.7 K/uL   Basophils Absolute 0.1 0.0 - 0.1 K/uL  Comprehensive metabolic panel     Status: Abnormal   Collection Time: 03/01/23  3:40 PM  Result Value Ref Range   Sodium 137 135 - 145 mEq/L   Potassium 3.9 3.5 - 5.1 mEq/L   Chloride 101 96 - 112 mEq/L   CO2 28 19 - 32 mEq/L   Glucose, Bld 93 70 - 99 mg/dL   BUN 26 (H) 6 - 23 mg/dL   Creatinine, Ser 9.35 0.40 - 1.20 mg/dL   Total Bilirubin 0.4 0.2 - 1.2 mg/dL   Alkaline Phosphatase 85 39 - 117 U/L   AST 23 0 - 37 U/L   ALT 26 0 - 35 U/L   Total Protein 7.0 6.0 - 8.3 g/dL   Albumin 4.4 3.5 - 5.2 g/dL   GFR 04.35 >39.99 mL/min    Comment: Calculated using the CKD-EPI Creatinine Equation (2021)   Calcium 9.4 8.4 - 10.5 mg/dL    Radiology CT ANGIO CHEST AORTA W/CM & OR WO/CM Result Date: 04/11/2023 CLINICAL DATA:  63 year old female with history of hand pain, evaluate for aortic aneurysm. EXAM: CT ANGIOGRAPHY CHEST WITH CONTRAST TECHNIQUE: Multidetector CT imaging of the chest was performed using the standard protocol during bolus administration of intravenous contrast. Multiplanar CT image reconstructions and MIPs were obtained to evaluate the vascular anatomy. RADIATION DOSE REDUCTION: This exam was performed according to the departmental dose-optimization program which includes automated exposure control, adjustment of the mA and/or kV according to patient size and/or use of  iterative reconstruction technique. CONTRAST:  Seventy-five mL Omnipaque  350, intravenous COMPARISON:  None Available. FINDINGS: Cardiovascular: Preferential opacification of the thoracic aorta. No evidence of thoracic aortic aneurysm or dissection. Normal heart size. No pericardial effusion. Sinues of Valsalva: 24 mm 27 x 27 mm Sinotubular Junction: 28 mm Ascending Aorta: 34 mm Aortic Arch: 28 mm Descending aorta: 22 mm at the level of the carina Branch vessels: Conventional branching pattern. No significant atherosclerotic changes. Coronary arteries: Normal origins and courses. No significant atherosclerotic calcifications. Main pulmonary artery: 22 mm. No evidence of central pulmonary embolism. Pulmonary veins: No anomalous pulmonary venous return. No evidence of left atrial appendage thrombus. Upper abdominal vasculature: Within normal limits. Mediastinum/Nodes: No enlarged mediastinal, hilar, or axillary lymph nodes. Thyroid  gland, trachea, and esophagus demonstrate no significant findings. Lungs/Pleura: No focal consolidations. No suspicious pulmonary nodules. No pleural effusion or pneumothorax. Upper Abdomen: The visualized upper abdomen is within normal limits. Musculoskeletal: No chest wall abnormality. No acute or significant osseous findings. Review of the MIP images confirms the above findings. IMPRESSION: Vascular: No significant intrathoracic vascular abnormality, specifically no evidence of thoracic aortic aneurysm. Non-Vascular: No acute or significant intrathoracic abnormality. Ester Sides, MD [facility] Radiology Electronically  Signed   By: Ester Sides M.D.   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  This demonstrates relatively normal arterial anatomy with no aneurysm, dissection, stenosis or  thrombosis to explain distal embolization.  We discussed that this episode could been from cardiac origin or small vessel disease versus vasospasm but it does not appear to be large vessel embolization from her CT scan and her duplex findings.  At this point, antiplatelet therapy is reasonable and no further vascular workup is planned.  I will see her back as needed.  Essential hypertension blood pressure control important in reducing the progression of atherosclerotic disease. On appropriate oral medications.    Selinda Gu, MD  04/26/2023 4:12 PM    This note was created with Dragon medical transcription system.  Any errors from dictation are purely unintentional

## 2023-04-26 NOTE — Assessment & Plan Note (Signed)
 blood pressure control important in reducing the progression of atherosclerotic disease. On appropriate oral medications.

## 2023-04-26 NOTE — Assessment & Plan Note (Signed)
 She has undergone a CT angiogram of the chest which I have independently reviewed.  This demonstrates relatively normal arterial anatomy with no aneurysm, dissection, stenosis or thrombosis to explain distal embolization.  We discussed that this episode could been from cardiac origin or small vessel disease versus vasospasm but it does not appear to be large vessel embolization from her CT scan and her duplex findings.  At this point, antiplatelet therapy is reasonable and no further vascular workup is planned.  I will see her back as needed.

## 2023-06-20 ENCOUNTER — Encounter: Payer: Self-pay | Admitting: Internal Medicine

## 2023-06-20 ENCOUNTER — Ambulatory Visit: Admitting: Internal Medicine

## 2023-06-20 ENCOUNTER — Ambulatory Visit: Payer: Self-pay | Admitting: Family Medicine

## 2023-06-20 VITALS — BP 120/88 | HR 76 | Ht 63.0 in | Wt 128.2 lb

## 2023-06-20 DIAGNOSIS — I10 Essential (primary) hypertension: Secondary | ICD-10-CM | POA: Diagnosis not present

## 2023-06-20 DIAGNOSIS — L237 Allergic contact dermatitis due to plants, except food: Secondary | ICD-10-CM | POA: Diagnosis not present

## 2023-06-20 HISTORY — DX: Allergic contact dermatitis due to plants, except food: L23.7

## 2023-06-20 MED ORDER — PREDNISONE 10 MG PO TABS: ORAL_TABLET | ORAL | 0 refills | Status: DC

## 2023-06-20 MED ORDER — TRIAMCINOLONE ACETONIDE 0.1 % EX CREA: 1.0000 | TOPICAL_CREAM | Freq: Two times a day (BID) | CUTANEOUS | 0 refills | Status: DC

## 2023-06-20 NOTE — Progress Notes (Signed)
 Acute Office Visit  Subjective:     Patient ID: Kathy Riley, female    DOB: 05/21/1960, 63 y.o.   MRN: [id number]  Chief Complaint  Patient presents with   Poison [facility] Pertinent negatives include no fever.   Patient is in today for poison ivy dermatitis.  Patient states that she was out in the garden last Sunday (8 days ago) and was trimming a vine which she realized was poison ivy.  She initially developed a rash over her arms which was red and oozing but then developed a rash over her chest including her breasts as well as her abdomen and a rash over the left side of her face.  Patient tried topical treatments including cortisone cream as well as Lidex cream in addition to antihistamines with no improvement.  Patient states she also tried a home remedy of turmeric and lemon with no improvement.  She came in today since her symptoms appear to be worsening despite topical treatment.  Review of Systems  Constitutional:  Negative for chills and fever.  Respiratory: Negative.    Cardiovascular: Negative.   Gastrointestinal: Negative.   Skin:  Positive for rash.  Neurological: Negative.         Objective:    BP 120/88   Pulse 76   Ht 5\' 3"  (1.6 m)   Wt 128 lb 3.2 oz (58.2 kg)   SpO2 98%   BMI 22.71 kg/m    Physical Exam Constitutional:      Appearance: Normal appearance.  HENT:     Head: Normocephalic and atraumatic.  Cardiovascular:     Rate and Rhythm: Normal rate and regular rhythm.     Heart sounds: Normal heart sounds.  Pulmonary:     Effort: Pulmonary effort is normal. No respiratory distress.     Breath sounds: Normal breath sounds. No wheezing.  Skin:    Comments: Erythematous, vesicular rash noted over bilateral forearms as well as chest, abdomen and right groin with clear drainage.  Neurological:     General: No focal deficit present.     Mental Status: She is alert and oriented to person, place, and time.  Psychiatric:        Mood  and Affect: Mood normal.        Behavior: Behavior normal.     No results found for any visits on 06/20/23.      Assessment & Plan:   Problem List Items Addressed This Visit       Cardiovascular and Mediastinum   Essential hypertension   -This problem is chronic and stable -Patient blood pressure today is 120/88 -She is on hydrochlorothiazide 12.5 mg daily. -Will continue to current dose for now. -Last BMP done in December showed normal potassium and normal renal function -No further workup at this time        Musculoskeletal and Integument   Poison ivy dermatitis - Primary   -This problem is acute and worsening -Patient with exposure to poison ivy last Sunday while gardening and developed an erythematous, vesicular rash with clear discharge over her bilateral forearms which spread to the left side of her face, chest, abdomen and right groin -Given the extent of involvement as well as facial involvement we will prescribe a combination of prednisone taper (starting at 40 mg daily and tapering down by 10 mg every 3 days) in combination with triamcinolone topical (not to be used on facial rash) -Patient also instructed to use Allegra during the  day as well as diphenhydramine at night to help with itching -Topical treatments such as Ivarest, Caladryl as well as Calaclear were also discussed to help with her symptoms -No further workup at this time -Return precautions given to the patient      Relevant Medications   triamcinolone cream (KENALOG) 0.1 %   predniSONE (DELTASONE) 10 MG tablet    Meds ordered this encounter  Medications   triamcinolone cream (KENALOG) 0.1 %    Sig: Apply 1 Application topically 2 (two) times daily.    Dispense:  30 g    Refill:  0   predniSONE (DELTASONE) 10 MG tablet    Sig: Take 4 tablets (40 mg total) by mouth daily with breakfast for 3 days, THEN 3 tablets (30 mg total) daily with breakfast for 3 days, THEN 2 tablets (20 mg total) daily with  breakfast for 3 days, THEN 1 tablet (10 mg total) daily with breakfast for 3 days.    Dispense:  30 tablet    Refill:  0    No follow-ups on file.  Earl Lagos, MD

## 2023-06-20 NOTE — Telephone Encounter (Signed)
 FYI- Patient is scheduled to see you at 12:30

## 2023-06-20 NOTE — Assessment & Plan Note (Signed)
-  This problem is acute and worsening -Patient with exposure to poison ivy last Sunday while gardening and developed an erythematous, vesicular rash with clear discharge over her bilateral forearms which spread to the left side of her face, chest, abdomen and right groin -Given the extent of involvement as well as facial involvement we will prescribe a combination of prednisone taper (starting at 40 mg daily and tapering down by 10 mg every 3 days) in combination with triamcinolone topical (not to be used on facial rash) -Patient also instructed to use Allegra during the day as well as diphenhydramine at night to help with itching -Topical treatments such as Ivarest, Caladryl as well as Calaclear were also discussed to help with her symptoms -No further workup at this time -Return precautions given to the patient

## 2023-06-20 NOTE — Telephone Encounter (Signed)
 Copied from CRM [id number]. Topic: Clinical - Red Word Triage >> Jun 20, 2023  7:58 AM Fonda Kinder J wrote: [facility] that prompted transfer to Nurse Triage: Poison Ivy, excessive itching and open sores on face   Chief Complaint: poison ivy Symptoms: oozing rash Frequency: constant Pertinent Negatives: Patient denies fever Disposition: [] ED /[] Urgent Care (no appt availability in office) / [] Appointment(In office/virtual)/ []  [place] [facility]/ [] Home Care/ [] Refused Recommended Disposition /[] [place] [facility]/ []  Follow-up with PCP Additional Notes: Was working in yard last week Monday. Thought she was trimming a vine, but it was poison ivy. Tried to treat with ointment previously prescribed last time but its getting worse. Appt scheduled today with Claris Che FNP at 12:30  Reason for Disposition  Large blisters or oozing sores  Answer Assessment - Initial Assessment Questions 1. APPEARANCE of RASH: "Describe the rash."      Severe rash, oozing  2. LOCATION: "Where is the rash located?"  (e.g., face, genitals, hands, legs)     Face, arms, torso, going to lower body  3. SIZE: "How large is the rash?"      Splotches, varying in sizes.   4. ONSET: "When did the rash begin?"      Started 1 week ago  5. ITCHING: "Does the rash itch?" If Yes, ask: "How bad is it?"   - MILD - doesn't interfere with normal activities   - MODERATE-SEVERE: interferes with work, school, sleep, or other activities      Moderate itchy  6. EXPOSURE:  "How were you exposed to the plant (poison ivy, poison oak, sumac)"  "When were you exposed?"       Was working in yard and was trimming bushes  7. PAST HISTORY: "Have you had a poison ivy rash before?" If Yes, ask: "How bad was it?"     Had used a previously an ointment before, wasn't this bad. The ointment helped  8. PREGNANCY: "Is there any chance you are pregnant?" "When was your last menstrual period?"     No  Protocols used: Poison Ivy - Oak -  [facility]

## 2023-06-20 NOTE — Assessment & Plan Note (Signed)
-  This problem is chronic and stable -Patient blood pressure today is 120/88 -She is on hydrochlorothiazide 12.5 mg daily. -Will continue to current dose for now. -Last BMP done in December showed normal potassium and normal renal function -No further workup at this time

## 2023-06-20 NOTE — Patient Instructions (Addendum)
-  It was a pleasure meeting you today -I have prescribed a topical steroid ointment called triamcinolone.  Please use that over the rash on your arms as well as your torso -I have also prescribed prednisone taper for you to help treat the poison ivy. -Please use Benadryl at night as well as Allegra during the day to help with the itching -You can also use topical treatments such as Ivarest, Caladryl or Calaclear to help with the symptoms of itching and oozing -If your symptoms do not improve or continue to worsen despite treatment please contact us for further evaluation.

## 2023-06-22 ENCOUNTER — Other Ambulatory Visit: Payer: Self-pay | Admitting: Internal Medicine

## 2023-06-22 ENCOUNTER — Other Ambulatory Visit: Payer: Self-pay | Admitting: Family Medicine

## 2023-06-22 DIAGNOSIS — L237 Allergic contact dermatitis due to plants, except food: Secondary | ICD-10-CM

## 2023-06-22 MED ORDER — TRIAMCINOLONE ACETONIDE 0.1 % EX CREA: 1.0000 | TOPICAL_CREAM | Freq: Two times a day (BID) | CUTANEOUS | 0 refills | Status: DC

## 2023-06-22 NOTE — Telephone Encounter (Signed)
 Copied from CRM [id number]. Topic: Clinical - Medication Refill >> Jun 22, 2023  9:45 AM Florestine Avers wrote: Most Recent Primary Care Visit:  Provider: Earl Lagos  Department: [facility]  Visit Type: ACUTE  Date: 06/20/2023  Medication: triamcinolone cream (KENALOG) 0.1 %  Has the patient contacted their pharmacy? Yes (Agent: If no, request that the patient contact the pharmacy for the refill. If patient does not wish to contact the pharmacy document the reason why and proceed with request.) (Agent: If yes, when and what did the pharmacy advise?)  Is this the correct pharmacy for this prescription? Yes If no, delete pharmacy and type the correct one.  This is the patient's preferred pharmacy:  [facility] [phone] [place] - [address] [place] [address] Phone: [phone] Fax: [phone]   Has the prescription been filled recently? Yes  Is the patient out of the medication? Yes  Has the patient been seen for an appointment in the last year OR does the patient have an upcoming appointment? Yes  Can we respond through MyChart? Yes  Agent: Please be advised that Rx refills may take up to 3 business days. We ask that you follow-up with your pharmacy.

## 2023-06-22 NOTE — Telephone Encounter (Signed)
 NOT [facility] PATIENT

## 2023-06-22 NOTE — Telephone Encounter (Signed)
 Medication sent to pharmacy on 06/20/23. Called [facility], unable to speak with staff. Call sent to voicemail, left message for pharmacist to call back regarding the Kenalog cream as it shows sent to the pharmacy on 06/20/23. Reorder cancelled and closing encounter.

## 2023-06-22 NOTE — Telephone Encounter (Signed)
 Patient states that the affected area is so large that she is using a good amount of the Triamcinolone cream and will need another tube of this cream. Patient states she is seeing progress in the healing.

## 2023-06-27 ENCOUNTER — Ambulatory Visit: Payer: Self-pay

## 2023-06-27 NOTE — Telephone Encounter (Signed)
 Copied From CRM [id number]. Reason for Triage: new rashes from poison ivy ,no improvement from last week   Chief Complaint: Poison ivy Symptoms: Rash, itching Frequency: 2 weeks Pertinent Negatives: Patient denies fever Disposition: [] ED /[] Urgent Care (no appt availability in office) / [x] Appointment(In office/virtual)/ []  [place] [facility]/ [] Home Care/ [] Refused Recommended Disposition /[] [place] [facility]/ []  Follow-up with PCP Additional Notes: Patient called in to report her poison ivy rash is not improving. Patient stated the rash started 2 weeks ago after she was trimming plants in her yard. Patient was seen in office on 06/20/23. Patient has been taking Allegra, Benadryl, Prednisone and applying Kenalog cream. Patient stated new spots of rash are still appearing. Patient stated rash is painful and itchy. Advised patient to see provider within 24 hours, per protocol. Scheduled patient tomorrow morning with PCP. Provided care advice and instructed patient to call back if symptoms worsen. Patient complied.   Reason for Disposition  [1] Taking oral steroids > 24 hours AND [2] rash becoming worse  Answer Assessment - Initial Assessment Questions 1. APPEARANCE of RASH: "Describe the rash."      Red, worst spot has healed up 2. LOCATION: "Where is the rash located?"  (e.g., face, genitals, hands, legs)     Both arms, under breasts and torso 3. SIZE: "How large is the rash?"      New spots are tiny, some spots are longer in size 4. ONSET: "When did the rash begin?"      First started about 2 weeks ago, states "every time she looks at herself, a new spot appears" 5. ITCHING: "Does the rash itch?" If Yes, ask: "How bad is it?"   - MILD - doesn't interfere with normal activities   - MODERATE-SEVERE: interferes with work, school, sleep, or other activities      Yes 6. EXPOSURE:  "How were you exposed to the plant (poison ivy, poison oak, sumac)"  "When were you exposed?"        Trimming plants outside 7. PAST HISTORY: "Have you had a poison ivy rash before?" If Yes, ask: "How bad was it?"     Yes, states it has never been this bad before  Protocols used: Poison Ivy - [facility] - [facility]

## 2023-06-28 ENCOUNTER — Encounter: Payer: Self-pay | Admitting: Family Medicine

## 2023-06-28 ENCOUNTER — Ambulatory Visit: Admitting: Family Medicine

## 2023-06-28 VITALS — BP 130/84 | HR 66 | Temp 98.0°F | Resp 20 | Ht 63.0 in | Wt 130.0 lb

## 2023-06-28 DIAGNOSIS — L237 Allergic contact dermatitis due to plants, except food: Secondary | ICD-10-CM

## 2023-06-28 LAB — COMPREHENSIVE METABOLIC PANEL WITH GFR
ALT: 32 U/L (ref 0–35)
AST: 17 U/L (ref 0–37)
Albumin: 4.5 g/dL (ref 3.5–5.2)
Alkaline Phosphatase: 69 U/L (ref 39–117)
BUN: 22 mg/dL (ref 6–23)
CO2: 29 meq/L (ref 19–32)
Calcium: 9.5 mg/dL (ref 8.4–10.5)
Chloride: 101 meq/L (ref 96–112)
Creatinine, Ser: 0.76 mg/dL (ref 0.40–1.20)
GFR: 84.61 mL/min (ref >60.00)
Glucose, Bld: 97 mg/dL (ref 70–99)
Potassium: 4.2 meq/L (ref 3.5–5.1)
Sodium: 138 meq/L (ref 135–145)
Total Bilirubin: 0.4 mg/dL (ref 0.2–1.2)
Total Protein: 6.6 g/dL (ref 6.0–8.3)

## 2023-06-28 LAB — CBC WITH DIFFERENTIAL/PLATELET
Basophils Absolute: 0 10*3/uL (ref 0.0–0.1)
Basophils Relative: 0.3 % (ref 0.0–3.0)
Eosinophils Absolute: 0.5 10*3/uL (ref 0.0–0.7)
Eosinophils Relative: 5 % (ref 0.0–5.0)
HCT: 43.2 % (ref 36.0–46.0)
Hemoglobin: 14.3 g/dL (ref 12.0–15.0)
Lymphocytes Relative: 17.9 % (ref 12.0–46.0)
Lymphs Abs: 1.8 10*3/uL (ref 0.7–4.0)
MCHC: 33.1 g/dL (ref 30.0–36.0)
MCV: 93.8 fl (ref 78.0–100.0)
Monocytes Absolute: 0.6 10*3/uL (ref 0.1–1.0)
Monocytes Relative: 6.6 % (ref 3.0–12.0)
Neutro Abs: 6.9 10*3/uL (ref 1.4–7.7)
Neutrophils Relative %: 70.2 % (ref 43.0–77.0)
Platelets: 333 10*3/uL (ref 150.0–400.0)
RBC: 4.6 Mil/uL (ref 3.87–5.11)
RDW: 13 % (ref 11.5–15.5)
WBC: 9.9 10*3/uL (ref 4.0–10.5)

## 2023-06-28 MED ORDER — PREDNISONE 50 MG PO TABS: ORAL_TABLET | ORAL | 0 refills | Status: DC

## 2023-06-28 MED ORDER — HYDROXYZINE HCL 10 MG PO TABS: 10.0000 mg | ORAL_TABLET | Freq: Three times a day (TID) | ORAL | 0 refills | Status: DC | PRN

## 2023-06-28 NOTE — Progress Notes (Addendum)
 SUBJECTIVE:   Chief Complaint  Patient presents with   Poison Ivy    X 2 weeks and most of it lower body   HPI Presents for acute visit  Discussed the use of AI scribe software for clinical note transcription with the patient, who gave verbal consent to proceed.  History of Present Illness Kathy Riley is a 63 year old female who presents with a severe skin reaction following exposure to a plant while gardening.  She developed a severe skin reaction after coming into contact with a grapevine while gardening on June 20, 2023. Despite washing the area immediately, a rash appeared two days later.  The rash worsened over the week, becoming 'black and nasty' and began to ooze by June 26, 2023. The symptoms initially improved with a higher dose of prednisone  but returned upon tapering the dose. She has been taking 20 mg of prednisone  daily, with today being the last day of a taper regimen.   She attempted to manage the symptoms with alcohol baths, oatmeal baths, and topical treatments such as Evarrest and an ointment, but noted that the ointment seemed to exacerbate the oozing. She also used Benadryl and Allegra to manage symptoms.  The severity of the symptoms has led her to work from home due to discomfort caused by wearing a bra, which she described as 'driving me nuts'. She is concerned about the impact of her clothing on the rash and has been diligent about washing anything that comes into contact with the affected area.    PERTINENT PMH / PSH: As above  OBJECTIVE:  BP 130/84   Pulse 66   Temp 98 F (36.7 C)   Resp 20   Ht 5\' 3"  (1.6 m)   Wt 130 lb (59 kg)   SpO2 99%   BMI 23.03 kg/m    Physical Exam Vitals reviewed.  Constitutional:      General: She is not in acute distress.    Appearance: Normal appearance. She is not ill-appearing, toxic-appearing or diaphoretic.  Eyes:     General:        Right eye: No discharge.        Left eye: No discharge.      Conjunctiva/sclera: Conjunctivae normal.  Cardiovascular:     Rate and Rhythm: Normal rate.  Pulmonary:     Effort: Pulmonary effort is normal.  Musculoskeletal:        General: Normal range of motion.  Skin:    General: Skin is warm and dry.     Findings: Erythema and rash present.  Neurological:     General: No focal deficit present.     Mental Status: She is alert and oriented to person, place, and time. Mental status is at baseline.  Psychiatric:        Mood and Affect: Mood normal.        Behavior: Behavior normal.        Thought Content: Thought content normal.        Judgment: Judgment normal.                            06/28/2023    9:50 AM 03/01/2023    3:43 PM 12/24/2022    8:28 AM 10/16/2021    3:08 PM 08/13/2021    8:27 AM  Depression screen PHQ 2/9  Decreased Interest 0 0 2 0 1  Down, Depressed, Hopeless 0 0 0 0 0  PHQ -  2 Score 0 0 2 0 1  Altered sleeping 0 2 2    Tired, decreased energy 0 2 3    Change in appetite 0 0 0    Feeling bad or failure about yourself  0 0 0    Trouble concentrating 0 0 0    Moving slowly or fidgety/restless 0 0 0    Suicidal thoughts 0 0 0    PHQ-9 Score 0 4 7    Difficult doing work/chores Not difficult at all Not difficult at all Not difficult at all        06/28/2023    9:51 AM 03/01/2023    3:43 PM 12/24/2022    8:28 AM  GAD 7 : Generalized Anxiety Score  Nervous, Anxious, on Edge 0 1 3  Control/stop worrying 0 1 2  Worry too much - different things 0 1 2  Trouble relaxing 0 0 2  Restless 0 0 0  Easily annoyed or irritable 0 0 1  Afraid - awful might happen 0 0 0  Total GAD 7 Score 0 3 10  Anxiety Difficulty Not difficult at all Not difficult at all Not difficult at all    ASSESSMENT/PLAN:  Poison ivy dermatitis Assessment & Plan: Severe contact dermatitis with recurrent symptoms after prednisone  tapering. Initial treatment effective but symptoms returned. Topical treatments and antihistamines used  for management. - Prescribe prednisone  for 2-3 weeks at a consistent dose. - Prescribe Atarax  25 mg TID PRN for itching, caution about drowsiness. - Advise frequent washing of clothing and bed sheets. - Provide work-from-home note for an additional week. - Order blood work to rule out other conditions. - Schedule follow-up in two weeks. - Advise reporting of any symptom worsening.  Orders: -     predniSONE ; Take 1 tablet daily for 2 weeks  Dispense: 14 tablet; Refill: 0 -     hydrOXYzine  HCl; Take 1 tablet (10 mg total) by mouth 3 (three) times daily as needed.  Dispense: 30 tablet; Refill: 0 -     CBC with Differential/Platelet -     Comprehensive metabolic panel with GFR     PDMP reviewed  Return if symptoms worsen or fail to improve, for PCP.  Valli Gaw, MD

## 2023-06-28 NOTE — Patient Instructions (Addendum)
 It was a pleasure meeting you today. Thank you for allowing me to take part in your health care.  Our goals for today as we discussed include:  We will get some labs today.  If they are abnormal or we need to do something about them, I will call you.  If they are normal, I will send you a message on MyChart (if it is active) or a letter in the mail.  If you don't hear from [place] in 2 weeks, please call the office at the number below.   Take Prednisone 50 mg daily for 2 weeks Take Atarax 10 mg at night.  Can take three times a day for itching  Wash clothing when in contact as can spread   Follow up in 2 weeks if no improvement    This is a list of the screening recommended for you and due dates:  Health Maintenance  Topic Date Due   DTaP/Tdap/Td vaccine (1 - Tdap) Never done   COVID-19 Vaccine (5 - 2024-25 season) 11/21/2022   Flu Shot  10/21/2023   Mammogram  01/04/2025   Colon Cancer Screening  11/17/2026   Pap with HPV screening  01/11/2028   Hepatitis C Screening  Completed   HIV Screening  Completed   Zoster (Shingles) Vaccine  Completed   HPV Vaccine  Aged Out    If you have any questions or concerns, please do not hesitate to call the office at [phone].  I look forward to our next visit and until then take care and stay safe.  Regards,   Dana Allan, MD   [facility]

## 2023-06-28 NOTE — Telephone Encounter (Signed)
 Noted.

## 2023-07-10 ENCOUNTER — Encounter: Payer: Self-pay | Admitting: Family Medicine

## 2023-07-10 NOTE — Assessment & Plan Note (Signed)
 Severe contact dermatitis with recurrent symptoms after prednisone  tapering. Initial treatment effective but symptoms returned. Topical treatments and antihistamines used for management. - Prescribe prednisone  for 2-3 weeks at a consistent dose. - Prescribe Atarax  25 mg TID PRN for itching, caution about drowsiness. - Advise frequent washing of clothing and bed sheets. - Provide work-from-home note for an additional week. - Order blood work to rule out other conditions. - Schedule follow-up in two weeks. - Advise reporting of any symptom worsening.

## 2023-08-06 ENCOUNTER — Other Ambulatory Visit: Payer: Self-pay | Admitting: Family Medicine

## 2023-08-06 DIAGNOSIS — I10 Essential (primary) hypertension: Secondary | ICD-10-CM

## 2023-08-09 ENCOUNTER — Encounter (INDEPENDENT_AMBULATORY_CARE_PROVIDER_SITE_OTHER): Payer: Self-pay

## 2023-08-10 ENCOUNTER — Other Ambulatory Visit: Payer: Self-pay

## 2023-08-10 ENCOUNTER — Other Ambulatory Visit: Payer: Self-pay | Admitting: Internal Medicine

## 2023-08-10 ENCOUNTER — Ambulatory Visit: Payer: Self-pay

## 2023-08-10 ENCOUNTER — Ambulatory Visit
Admission: EM | Admit: 2023-08-10 | Discharge: 2023-08-10 | Disposition: A | Discharge status: Home / Self Care | Attending: Family Medicine | Admitting: Family Medicine

## 2023-08-10 DIAGNOSIS — L309 Dermatitis, unspecified: Secondary | ICD-10-CM

## 2023-08-10 DIAGNOSIS — L237 Allergic contact dermatitis due to plants, except food: Secondary | ICD-10-CM

## 2023-08-10 MED ORDER — PREDNISONE 10 MG (21) PO TBPK: ORAL_TABLET | Freq: Every day | ORAL | 0 refills | Status: DC

## 2023-08-10 MED ORDER — TRIAMCINOLONE ACETONIDE 0.1 % EX CREA: 1.0000 | TOPICAL_CREAM | Freq: Two times a day (BID) | CUTANEOUS | 0 refills | Status: DC

## 2023-08-10 NOTE — ED Provider Notes (Signed)
 [facility]    CSN: [id number] Arrival date & time: 08/10/23  1256      History   Chief Complaint Chief Complaint  Patient presents with   Rash    HPI Kathy Riley is a 63 y.o. female presents for rash.  Patient reports 2 days of a pruritic red rash on her arms, legs, torso.  States it began after she was laid out by the pool.  She used a sunscreen that she has used in the past and denies any other new products such as soaps, detergents, lotions, medications, etc.  No history of eczema or psoriasis.  No contact with similar rashes.  She has been taking Benadryl/allergy medicine OTC.  She also had a prescription for triamcinolone  which she states has helped the rash particularly on her hands.  No other concerns at this time.   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   Discoloration of skin of finger 03/06/2023   Need for influenza vaccination 01/09/2023   Prediabetes 01/09/2023   Chest wall pain 01/09/2023   Encounter for screening for HIV 01/01/2023   Need for hepatitis C screening test 01/01/2023   Breast cancer screening by mammogram 01/01/2023   Vitamin B 12 deficiency 01/01/2023   Abnormal glucose 01/01/2023   Memory changes 01/01/2023   Presbyopia 01/01/2023   Decreased grip strength of right hand 08/13/2021   Decreased grip strength of left hand 08/13/2021   Right elbow pain  08/13/2021   FH: stroke 08/13/2021   Annual physical exam 02/12/2020   Hoarseness 02/12/2020   Hot flashes 02/12/2020   Urinary incontinence 02/12/2020   PND (post-nasal drip) 02/12/2020   Calculus of kidney and ureter 12/09/2017   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   Mass of left breast 12/09/2017   Polyp of colon 12/09/2017   Chronic midline thoracic back pain 12/07/2017   Abnormal perimenopausal bleeding 06/30/2017   Cyst of ovary 06/30/2017   Irregular periods 06/30/2017   Lower urinary tract symptoms 06/30/2017   Pain in pelvis 06/30/2017   Uterine leiomyoma 06/30/2017   Vaginal dryness 06/30/2017   Fatigue 03/23/2017   Myalgia 03/23/2017   Arthralgia 03/23/2017   Mid back pain 03/23/2017   RUQ abdominal pain 03/23/2017   Vitamin D  deficiency 03/23/2017   HLD (hyperlipidemia) 03/23/2017   Left lower quadrant pain 09/30/2016   Dysfunction of right eustachian tube 09/30/2016   Neck swelling 05/17/2016   Herpes zoster without complication 05/17/2016   Palpable mass of lower back 04/13/2016   Sinusitis 04/13/2016   Chronic neck pain 05/06/2015   Allergic rhinitis 05/06/2015   Bursitis, foot 11/04/2014   Loss of transverse plantar arch of left foot 11/04/2014   Essential hypertension 10/08/2014  Left foot pain 10/08/2014    Past Surgical History:  Procedure Laterality Date   2008 fibroid surgery      laparoscopy    APPENDECTOMY     BREAST SURGERY     right breast benign bx   COLONOSCOPY     COLONOSCOPY W/ POLYPECTOMY     2014    DILATION AND CURETTAGE OF UTERUS     x 3 2/2 miscarriages    TONSILLECTOMY     WISDOM TOOTH EXTRACTION      OB History   No obstetric history on file.      Home Medications    Prior to Admission medications   Medication Sig Start Date End Date Taking? Authorizing Provider  predniSONE  (STERAPRED UNI-PAK 21 TAB) 10 MG (21) TBPK tablet Take by mouth daily. Take 6 tabs by mouth daily  for 1 day, then 5 tabs for 1 day, then 4 tabs  for 1 day, then 3 tabs for 1 day, 2 tabs for 1 day, then 1 tab by mouth daily for 1 days 08/10/23  Yes Motty Borin, Jodi R, NP  triamcinolone  cream (KENALOG ) 0.1 % Apply 1 Application topically 2 (two) times daily. 08/10/23  Yes Alleen Arbour, NP  aspirin  EC 81 MG tablet Take 1 tablet (81 mg total) by mouth daily. Swallow whole. 03/01/23   Walsh, Tanya, MD  Cholecalciferol (VITAMIN D3 PO) Take 10,000 Units by mouth daily.     [provider]  fexofenadine (ALLEGRA) 180 MG tablet Take 180 mg by mouth at bedtime as needed for allergies or rhinitis.    [provider]  fluocinonide  cream (LIDEX ) 0.05 % Apply 1 Application topically 2 (two) times daily. Poison ivy, bug bites not for face/groin/underarms 10/16/21   McLean-Scocuzza, Karon Packer, MD  hydrochlorothiazide  (MICROZIDE ) 12.5 MG capsule TAKE 1 CAPSULE BY MOUTH EVERY DAY IN THE MORNING 08/08/23   Valli Gaw, MD  hydrOXYzine  (ATARAX ) 10 MG tablet Take 1 tablet (10 mg total) by mouth 3 (three) times daily as needed. 06/28/23   Valli Gaw, MD  naproxen  (NAPROSYN ) 500 MG tablet Take 500 mg by mouth 2 (two) times daily with a meal.    [provider]    Family History Family History  Problem Relation Age of Onset   Hypertension Mother    Dementia Mother        61/62   Prostate cancer Father    Alcohol abuse Father    Hypertension Father    Colon cancer Father        73   Colon polyps Brother    Stroke Brother    Stroke Brother    Dementia Maternal Aunt    Cancer - Ovarian Maternal Aunt    Stroke Maternal Grandmother    Hypertension Maternal Grandmother    Hypertension Maternal Grandfather    Hypertension Paternal Grandmother    Stroke Paternal Grandfather    Hypertension Paternal Grandfather    Colon cancer Other        60-70   Crohn's disease Neg Hx    Esophageal cancer Neg Hx    Rectal cancer Neg Hx    Stomach cancer Neg Hx     Social History Social History   Tobacco Use   Smoking status: Never    Passive  exposure: Never   Smokeless tobacco: Never  Vaping Use   Vaping status: Never Used  Substance Use Topics   Alcohol use: Yes    Alcohol/week: 6.0 standard drinks of alcohol  Types: 5 Glasses of wine, 1 Shots of liquor per week   Drug use: No     Allergies   Ibuprofen and Penicillins   Review of Systems Review of Systems  Skin:  Positive for rash.     Physical Exam Triage Vital Signs ED Triage Vitals  Encounter Vitals Group     BP 08/10/23 1342 (!) 146/91     Systolic BP Percentile --      Diastolic BP Percentile --      Pulse Rate 08/10/23 1342 69     Resp 08/10/23 1342 16     Temp 08/10/23 1342 98.2 F (36.8 C)     Temp Source 08/10/23 1342 Oral     SpO2 08/10/23 1342 98 %     Weight --      Height --      Head Circumference --      Peak Flow --      Pain Score 08/10/23 1339 0     Pain Loc --      Pain Education --      Exclude from Growth Chart --    No data found.  Updated Vital Signs BP (!) 146/91   Pulse 69   Temp 98.2 F (36.8 C) (Oral)   Resp 16   SpO2 98%   Visual Acuity Right Eye Distance:   Left Eye Distance:   Bilateral Distance:    Right Eye Near:   Left Eye Near:    Bilateral Near:     Physical Exam Vitals and nursing note reviewed.  Constitutional:      General: She is not in acute distress.    Appearance: Normal appearance. She is not ill-appearing.  HENT:     Head: Normocephalic and atraumatic.  Eyes:     Pupils: Pupils are equal, round, and reactive to light.  Cardiovascular:     Rate and Rhythm: Normal rate.  Pulmonary:     Effort: Pulmonary effort is normal.  Skin:    General: Skin is warm and dry.     Findings: Rash is not crusting, nodular, purpuric, pustular, scaling, urticarial or vesicular.     Comments: There is a mildly pruritic macular papular rash on torso, arms and legs.  There is no swelling, drainage, warmth  Neurological:     General: No focal deficit present.     Mental Status: She is alert and  oriented to person, place, and time.  Psychiatric:        Mood and Affect: Mood normal.        Behavior: Behavior normal.      UC Treatments / Results  Labs (all labs ordered are listed, but only abnormal results are displayed) Labs Reviewed - No data to display  EKG   Radiology No results found.  Procedures Procedures (including critical care time)  Medications Ordered in UC Medications - No data to display  Initial Impression / Assessment and Plan / UC Course  I have reviewed the triage vital signs and the nursing notes.  Pertinent labs & imaging results that were available during my care of the patient were reviewed by me and considered in my medical decision making (see chart for details).     Reviewed exam and symptoms with patient.  No red flags.  Refilled patient's triamcinolone  as she felt this has been helpful.  Given widespread distribution of rash we will do prednisone  taper.  May continue OTC allergy medicine as needed.  Advised PCP follow-up if  symptoms do not improve.  ER precautions reviewed and patient verbalized understanding. Final Clinical Impressions(s) / UC Diagnoses   Final diagnoses:  Dermatitis     Discharge Instructions      Start triamcinolone  topically to the rash twice daily as needed.  Start prednisone  taper as prescribed.  You may continue over-the-counter allergy medicine such as Zyrtec or Allegra.  Please follow-up with your PCP if your symptoms do not improve.  Please go to the ER for any worsening symptoms.  Hope you feel better soon!  ED Prescriptions     Medication Sig Dispense Auth. Provider   triamcinolone  cream (KENALOG ) 0.1 % Apply 1 Application topically 2 (two) times daily. 45 g Ernestyne Caldwell, Jodi R, NP   predniSONE  (STERAPRED UNI-PAK 21 TAB) 10 MG (21) TBPK tablet Take by mouth daily. Take 6 tabs by mouth daily  for 1 day, then 5 tabs for 1 day, then 4 tabs for 1 day, then 3 tabs for 1 day, 2 tabs for 1 day, then 1 tab by mouth  daily for 1 days 21 tablet Yaa Donnellan, Jodi R, NP      PDMP not reviewed this encounter.   Alleen Arbour, NP 08/10/23 1357

## 2023-08-10 NOTE — Telephone Encounter (Signed)
 NOT [facility] PATIENT

## 2023-08-10 NOTE — Telephone Encounter (Signed)
Patient is at Urgent Care now.

## 2023-08-10 NOTE — ED Triage Notes (Addendum)
 Pt c/o rash on arms bilat, legs bilat and backx2d. Pt has blotches of red raised bumps. Pt states she used sunscreen on Sat or Sun that is new. PT states that is the only new thing she has used. PT denies new meds. Pt states the triamcinolone  has helped the rash on posterior of hands.

## 2023-08-10 NOTE — Telephone Encounter (Signed)
 Chief Complaint: hives Symptoms: hives, rash, heat, itching, swelling  Frequency: Monday Pertinent Negatives: Patient denies fever, facial swelling, lip or tongue swelling, difficulty breathing Disposition: [] ED /[x] Urgent Care (no appt availability in office) / [] Appointment(In office/virtual)/ []  [facility]/ [] Home Care/ [] Refused Recommended Disposition /[] [facility]/ []  Follow-up with PCP Additional Notes: Pt reports what she believes are hives since Monday. Pt does not know what she may be allergic to. Pt states the hives are on her legs and arms. Pt states the hives are red, swollen, bumpy and causing swelling to her arms. Pt endorses severe itching. No relief with Allegra. No availability in the office today. RN advised pt be seen at an UC and gave pt the address and wait time of the UC closest to her. RN advised pt should she develop difficulty breathing or swelling to her face or tongue, she must call 911. Pt verbalized understanding.    Copied from CRM [id number]. Topic: Clinical - Red Word Triage >> Aug 10, 2023 12:18 PM Geneva B wrote: [facility] that prompted transfer to Nurse Triage: patient has hives and swelling on arm Reason for Disposition  [1] MODERATE-SEVERE hives persist (i.e., hives interfere with normal activities or work) AND [2] taking antihistamine (e.g., Benadryl, Claritin) > 24 hours  Answer Assessment - Initial Assessment Questions 1. APPEARANCE: "What does the rash look like?"      Looks like hives, "it's on my legs, my knees, pretty bad on my arms"; it's everywhere 2. LOCATION: "Where is the rash located?"      Legs, knees, arms 3. NUMBER: "How many hives are there?"      Many  4. SIZE: "How big are the hives?" (inches, cm, compare to coins) "Do they all look the same or is there lots of variation in shape and size?"      "Big red patches on my arm; it looks like it could be an allergic reaction, but I have not changed anything that I have  been doing, I just went through a process where I was treated for poison ivy but this looks different" 5. ONSET: "When did the hives begin?" (Hours or days ago)      Monday it started on her hands, then it progressively got worse; when she woke up Tuesday it had spread to her arms 6. ITCHING: "Does it itch?" If Yes, ask: "How bad is the itch?"    - MILD: doesn't interfere with normal activities   - MODERATE-SEVERE: interferes with work, school, sleep, or other activities      Severe; states she had some ointment but has run out (Triamcinolone ). Used it on her hands and it helped 7. RECURRENT PROBLEM: "Have you had hives before?" If Yes, ask: "When was the last time?" and "What happened that time?"      Yes, nothing this severe  8. TRIGGERS: "Were you exposed to any new food, plant, cosmetic product or animal just before the hives began?"     Poison ivy in April  9. OTHER SYMPTOMS: "Do you have any other symptoms?" (e.g., fever, tongue swelling, difficulty breathing, abdomen pain)     Swelling to bilateral arms - "upper arms are almost fully red, the patches have gotten bigger and more bumpy, they are swelling", very hot to the touch and itchy. Denies difficulty breathing. Denies tongue swelling. Denies fever.  Protocols used: Hives-A-AH

## 2023-08-10 NOTE — Discharge Instructions (Addendum)
 Start triamcinolone  topically to the rash twice daily as needed.  Start prednisone  taper as prescribed.  You may continue over-the-counter allergy medicine such as Zyrtec or Allegra.  Please follow-up with your PCP if your symptoms do not improve.  Please go to the ER for any worsening symptoms.  Hope you feel better soon!

## 2023-08-10 NOTE — Telephone Encounter (Signed)
 Called pt and let her know this information.

## 2023-11-15 ENCOUNTER — Other Ambulatory Visit: Payer: Self-pay | Admitting: Obstetrics and Gynecology

## 2023-11-15 DIAGNOSIS — Z1231 Encounter for screening mammogram for malignant neoplasm of breast: Secondary | ICD-10-CM

## 2024-01-18 LAB — HM PAP SMEAR: HPV, high-risk: NEGATIVE

## 2024-01-26 ENCOUNTER — Ambulatory Visit

## 2024-01-26 VITALS — BP 110/60 | HR 73 | Temp 98.0°F | Ht 62.0 in | Wt 127.2 lb

## 2024-01-26 DIAGNOSIS — M2012 Hallux valgus (acquired), left foot: Secondary | ICD-10-CM

## 2024-01-26 DIAGNOSIS — I10 Essential (primary) hypertension: Secondary | ICD-10-CM | POA: Diagnosis not present

## 2024-01-26 DIAGNOSIS — I742 Embolism and thrombosis of arteries of the upper extremities: Secondary | ICD-10-CM

## 2024-01-26 DIAGNOSIS — K219 Gastro-esophageal reflux disease without esophagitis: Secondary | ICD-10-CM

## 2024-01-26 DIAGNOSIS — E785 Hyperlipidemia, unspecified: Secondary | ICD-10-CM

## 2024-01-26 DIAGNOSIS — M2011 Hallux valgus (acquired), right foot: Secondary | ICD-10-CM | POA: Diagnosis not present

## 2024-01-26 DIAGNOSIS — L989 Disorder of the skin and subcutaneous tissue, unspecified: Secondary | ICD-10-CM | POA: Insufficient documentation

## 2024-01-26 DIAGNOSIS — L237 Allergic contact dermatitis due to plants, except food: Secondary | ICD-10-CM

## 2024-01-26 DIAGNOSIS — F5101 Primary insomnia: Secondary | ICD-10-CM

## 2024-01-26 DIAGNOSIS — L819 Disorder of pigmentation, unspecified: Secondary | ICD-10-CM

## 2024-01-26 DIAGNOSIS — R5383 Other fatigue: Secondary | ICD-10-CM

## 2024-01-26 DIAGNOSIS — J301 Allergic rhinitis due to pollen: Secondary | ICD-10-CM

## 2024-01-26 DIAGNOSIS — R7309 Other abnormal glucose: Secondary | ICD-10-CM | POA: Diagnosis not present

## 2024-01-26 DIAGNOSIS — M4316 Spondylolisthesis, lumbar region: Secondary | ICD-10-CM | POA: Insufficient documentation

## 2024-01-26 DIAGNOSIS — N3946 Mixed incontinence: Secondary | ICD-10-CM

## 2024-01-26 LAB — COMPREHENSIVE METABOLIC PANEL WITH GFR
ALT: 35 U/L (ref 0–35)
AST: 26 U/L (ref 0–37)
Albumin: 4.5 g/dL (ref 3.5–5.2)
Alkaline Phosphatase: 96 U/L (ref 39–117)
BUN: 19 mg/dL (ref 6–23)
CO2: 29 meq/L (ref 19–32)
Calcium: 9.4 mg/dL (ref 8.4–10.5)
Chloride: 100 meq/L (ref 96–112)
Creatinine, Ser: 0.61 mg/dL (ref 0.40–1.20)
GFR: 96.14 mL/min (ref >60.00)
Glucose, Bld: 82 mg/dL (ref 70–99)
Potassium: 4.2 meq/L (ref 3.5–5.1)
Sodium: 137 meq/L (ref 135–145)
Total Bilirubin: 0.5 mg/dL (ref 0.2–1.2)
Total Protein: 6.8 g/dL (ref 6.0–8.3)

## 2024-01-26 LAB — LIPID PANEL
Cholesterol: 242 mg/dL — ABNORMAL HIGH (ref 0–200)
HDL: 88.6 mg/dL (ref >39.00)
LDL Cholesterol: 137 mg/dL — ABNORMAL HIGH (ref 0–99)
NonHDL: 153.37
Total CHOL/HDL Ratio: 3
Triglycerides: 82 mg/dL (ref 0.0–149.0)
VLDL: 16.4 mg/dL (ref 0.0–40.0)

## 2024-01-26 LAB — HEMOGLOBIN A1C: Hgb A1c MFr Bld: 5.7 % (ref 4.6–6.5)

## 2024-01-26 MED ORDER — NAPROXEN 500 MG PO TABS: 500.0000 mg | ORAL_TABLET | Freq: Two times a day (BID) | ORAL | 0 refills | Status: DC | PRN

## 2024-01-26 MED ORDER — HYDROCHLOROTHIAZIDE 12.5 MG PO CAPS: ORAL_CAPSULE | ORAL | 1 refills | Status: AC

## 2024-01-26 NOTE — Assessment & Plan Note (Signed)
-   Chronic insomnia managed with melatonin as needed, effective but causes grogginess with prolonged use. - Continue melatonin as needed, avoid prolonged use. - Maintain good sleep hygiene.

## 2024-01-26 NOTE — Assessment & Plan Note (Deleted)
 Kathy Riley

## 2024-01-26 NOTE — Assessment & Plan Note (Signed)
 Chronic pain and stiffness, prefers naproxen  over Ibuprofen.  Discussed NSAID side effects. - Prescribed naproxen  200 mg, BID daily as needed. - Continue physical therapy and exercises. - Monitor kidney function periodically. - Consider neurosurgeon referral if symptoms worsen. Orders:   naproxen  (NAPROSYN ) 500 MG tablet; Take 1 tablet (500 mg total) by mouth 2 (two) times daily between meals as needed.

## 2024-01-26 NOTE — Assessment & Plan Note (Addendum)
 Check A1c  Orders:    Hemoglobin A1c

## 2024-01-26 NOTE — Assessment & Plan Note (Deleted)
 SABRA

## 2024-01-26 NOTE — Assessment & Plan Note (Addendum)
 Chronic, intermittent symptoms. Risk factor management including reducing alcohol intake, caution with NSAIDs discussed.

## 2024-01-26 NOTE — Assessment & Plan Note (Addendum)
 Chronic bilateral hallux valgus, worsening pain. Recommend podiatry evaluation, referral made.    Orders:   Ambulatory referral to Podiatry

## 2024-01-26 NOTE — Assessment & Plan Note (Addendum)
 Previously seen by Dr. Marea, underwent, visit note states  relatively normal arterial anatomy with no aneurysm, dissection, stenosis or thrombosis to explain distal embolization.  We discussed that this episode could been from cardiac origin or small vessel disease versus vasospasm but it does not appear to be large vessel embolization from her CT scan and her duplex findings.  At this point, antiplatelet therapy is reasonable and no further vascular workup is planned. Patient previously took aspirin  81 mg but is off since she had vascular appointment. She has not noted reoccurrence of symptoms. Discuss this during future visit as well.

## 2024-01-26 NOTE — Assessment & Plan Note (Signed)
 Undergoing topical treatment with expected inflammatory reaction, continue f/u with [facility] dermatology.

## 2024-01-26 NOTE — Assessment & Plan Note (Signed)
 Incontinence increasing over the past year.  Recommend pelvic floor exercises. Await urology appointment for further evaluation.

## 2024-01-26 NOTE — Assessment & Plan Note (Addendum)
 BP within goal, continue hydrochlorothiazide  12.5 mg once daily, in the morning. Check CMP. Monitor blood pressure at home. Consider changing antihypertensive if urology evaluation for mixed incontinence is reassuring. Follow up in 6 months.  Orders:   hydrochlorothiazide  (MICROZIDE ) 12.5 MG capsule; TAKE 1 CAPSULE BY MOUTH EVERY DAY IN THE MORNING   Comprehensive metabolic panel with GFR

## 2024-01-26 NOTE — Assessment & Plan Note (Addendum)
 Chronic, intermittent flare up during pollen season, stable on Fexofenadine 180 mg prn daily, continue.

## 2024-01-26 NOTE — Assessment & Plan Note (Addendum)
 Check fasting lipid panel.   Orders:    Lipid panel

## 2024-01-26 NOTE — Progress Notes (Signed)
 Established Patient Office Visit TOC from Dr. Hope    Subjective  Patient ID: Kathy  Riley, female    DOB: 12-Oct-1960  Age: 63 y.o. MRN: [id number]  Chief Complaint  Patient presents with   Establish Care   Back Pain   Hip Pain   Eye Problem   Hypertension    Discussed the use of AI scribe software for clinical note transcription with the patient, who gave verbal consent to proceed.  History of Present Illness Kathy  Riley is a 63 year old female who presents for transfer of care from previous PCP and for follow-up/management of multiple health issues.  - She is undergoing treatment for melanoma with a topical cream from [facility], currently in the fifth week. She hopes for effective results.  - She experiences worsening urinary incontinence over the past year, with leakage during activities like coughing and sneezing, and sometimes without warning. She has an upcoming urology appointment for further evaluation. She does not perform pelvic floor exercises and has not tried pelvic floor therapy.  - She has history of bilateral trochanteric bursitis, lumbar spondylolisthesis associated with intermittent stiffness and pain predominantly on her left side, affecting her lower back, hip, and foot. She manages pain with 400 mg of ibuprofen daily as needed and has previously used naproxen , which she found more effective. She has undergone physical therapy, treatment with oral steroid through [facility] for this.   - She has a history of  prominent MTP joint on both feet which is causing her pain. She is interested in seeing a podiatrist for this.   - She has a history of acid reflux, which can be exacerbated by NSAIDs. She is cautious about medication side effects and prefers to take medications only when necessary.  - She has a history of elevated blood pressure, which she monitors at home. She takes hydrochlorothiazide  12.5 mg daily in the morning.   - She has a  history of allergies, including a reaction to ibuprofen at higher doses, causing itchy and dry eyes. She takes Allegra as needed for allergy symptoms and has used hydrocortisone cream for poison ivy in the past.  - She reports sleep disturbances, a common issue in her family. She sometimes uses melatonin, which helps but can cause grogginess if used for more than three days. She practices good sleep hygiene and meditation to manage stress.  - She consumes alcohol, approximately two glasses per day, and acknowledges the need to cut back. She is aware of the interaction between alcohol and naproxen .   ROS As per HPI    Objective:     BP 110/60 (BP Location: Right Arm, Patient Position: Sitting, Cuff Size: Normal)   Pulse 73   Temp 98 F (36.7 C) (Oral)   Ht 5' 2 (1.575 m)   Wt 127 lb 3.2 oz (57.7 kg)   SpO2 99%   BMI 23.27 kg/m      01/26/2024   11:09 AM 06/28/2023    9:50 AM 03/01/2023    3:43 PM  Depression screen PHQ 2/9  Decreased Interest 0 0 0  Down, Depressed, Hopeless 0 0 0  PHQ - 2 Score 0 0 0  Altered sleeping 3 0 2  Tired, decreased energy 2 0 2  Change in appetite 0 0 0  Feeling bad or failure about yourself  0 0 0  Trouble concentrating 0 0 0  Moving slowly or fidgety/restless 0 0 0  Suicidal thoughts 0 0 0  PHQ-9 Score 5  0  4   Difficult doing work/chores Not difficult at all Not difficult at all Not difficult at all     Data saved with a previous flowsheet row definition      01/26/2024   11:09 AM 06/28/2023    9:51 AM 03/01/2023    3:43 PM 12/24/2022    8:28 AM  GAD 7 : Generalized Anxiety Score  Nervous, Anxious, on Edge 1 0 1 3  Control/stop worrying 0 0 1 2  Worry too much - different things 1 0 1 2  Trouble relaxing 2 0 0 2  Restless 0 0 0 0  Easily annoyed or irritable 1 0 0 1  Afraid - awful might happen 0 0 0 0  Total GAD 7 Score 5 0 3 10  Anxiety Difficulty Not difficult at all Not difficult at all Not difficult at all Not difficult at all       01/26/2024   11:09 AM 06/28/2023    9:50 AM 03/01/2023    3:43 PM  Depression screen PHQ 2/9  Decreased Interest 0 0 0  Down, Depressed, Hopeless 0 0 0  PHQ - 2 Score 0 0 0  Altered sleeping 3 0 2  Tired, decreased energy 2 0 2  Change in appetite 0 0 0  Feeling bad or failure about yourself  0 0 0  Trouble concentrating 0 0 0  Moving slowly or fidgety/restless 0 0 0  Suicidal thoughts 0 0 0  PHQ-9 Score 5 0  4   Difficult doing work/chores Not difficult at all Not difficult at all Not difficult at all     Data saved with a previous flowsheet row definition      01/26/2024   11:09 AM 06/28/2023    9:51 AM 03/01/2023    3:43 PM 12/24/2022    8:28 AM  GAD 7 : Generalized Anxiety Score  Nervous, Anxious, on Edge 1 0 1 3  Control/stop worrying 0 0 1 2  Worry too much - different things 1 0 1 2  Trouble relaxing 2 0 0 2  Restless 0 0 0 0  Easily annoyed or irritable 1 0 0 1  Afraid - awful might happen 0 0 0 0  Total GAD 7 Score 5 0 3 10  Anxiety Difficulty Not difficult at all Not difficult at all Not difficult at all Not difficult at all   SDOH Screenings   Depression (PHQ2-9): Medium Risk (01/26/2024)  Tobacco Use: Low Risk  (01/26/2024)     Physical Exam Constitutional:      General: She is not in acute distress.    Appearance: Normal appearance.  HENT:     Head: Normocephalic and atraumatic.     Right Ear: Tympanic membrane normal.     Left Ear: Tympanic membrane normal.     Mouth/Throat:     Mouth: Mucous membranes are moist.  Neck:     Thyroid : No thyroid  mass or thyroid  tenderness.  Cardiovascular:     Rate and Rhythm: Normal rate and regular rhythm.  Pulmonary:     Effort: Pulmonary effort is normal.     Breath sounds: Normal breath sounds.  Abdominal:     General: Bowel sounds are normal.     Palpations: Abdomen is soft.     Tenderness: There is no guarding.  Musculoskeletal:     Cervical back: Neck supple. No rigidity.     Right lower leg: No  edema.     Left lower  leg: No edema.  Skin:    General: Skin is warm.  Neurological:     Mental Status: She is alert and oriented to person, place, and time.  Psychiatric:        Mood and Affect: Mood normal.        Behavior: Behavior normal.        No results found for any visits on 01/26/24.  The 10-year ASCVD risk score (Arnett DK, et al., 2019) is: 3%     Assessment & Plan:  Discussed and recommend updating consider tetanus, pneumonia, COVID-19, vaccination at local pharmacy. Has a history of dermatitis secondary to poison ivy exposure managed by prn use of Lidex  0.05%. Not needing refill at this time.   Assessment & Plan Essential hypertension BP within goal, continue hydrochlorothiazide  12.5 mg once daily, in the morning. Check CMP. Monitor blood pressure at home. Consider changing antihypertensive if urology evaluation for mixed incontinence is reassuring. Follow up in 6 months.  Orders:   hydrochlorothiazide  (MICROZIDE ) 12.5 MG capsule; TAKE 1 CAPSULE BY MOUTH EVERY DAY IN THE MORNING   Comprehensive metabolic panel with GFR  Abnormal glucose Check A1c. Orders:   Hemoglobin A1c  Hyperlipidemia, unspecified hyperlipidemia type Check fasting lipid panel.  Orders:   Lipid panel  Hallux valgus, acquired, bilateral Chronic bilateral hallux valgus, worsening pain. Recommend podiatry evaluation, referral made.    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 Marea, underwent, visit note states  relatively normal arterial anatomy with no aneurysm, dissection, stenosis or thrombosis to explain distal embolization.  We discussed that this episode could been from cardiac origin or small vessel disease versus vasospasm but it does not appear to be large vessel embolization from her CT scan and her duplex findings.  At this point, antiplatelet therapy is reasonable and no further vascular  workup is planned. Patient previously took aspirin  81 mg but is off since she had vascular appointment. She has not noted reoccurrence of symptoms. Discuss this during future visit as well.      Seasonal allergic rhinitis due to pollen Chronic, intermittent flare up during pollen season, stable on Fexofenadine 180 mg prn daily, continue.      Gastroesophageal reflux disease, unspecified whether esophagitis present Chronic, intermittent symptoms. Risk factor management including reducing alcohol intake, caution with NSAIDs discussed.      Spondylolisthesis of lumbar region Chronic pain and stiffness, prefers naproxen  over Ibuprofen.  Discussed NSAID side effects. - Prescribed naproxen  200 mg, BID daily as needed. - Continue physical therapy and exercises. - Monitor kidney function periodically. - Consider neurosurgeon referral if symptoms worsen. Orders:   naproxen  (NAPROSYN ) 500 MG tablet; Take 1 tablet (500 mg total) by mouth 2 (two) times daily between meals as needed.  Primary insomnia - Chronic insomnia managed with melatonin as needed, effective but causes grogginess with prolonged use. - Continue melatonin as needed, avoid prolonged use. - Maintain good sleep hygiene.     Skin lesion of chest wall Undergoing topical treatment with expected inflammatory reaction, continue f/u with [facility] dermatology.     Mixed stress and urge urinary incontinence Incontinence increasing over the past year.  Recommend pelvic floor exercises. Await urology appointment for further evaluation.        Return in about 6 months (around 07/25/2024) for Chronic, HTN follow up .   Luke Shade, MD

## 2024-01-27 ENCOUNTER — Ambulatory Visit: Payer: Self-pay

## 2024-01-27 NOTE — Progress Notes (Signed)
 The 10-year ASCVD risk score (Arnett DK, et al., 2019) is: 3.1%   Values used to calculate the score:     Age: 63 years     Clincally relevant sex: Female     Is Non-Hispanic African American: No     Diabetic: No     Tobacco smoker: No     Systolic Blood Pressure: 110 mmHg     Is BP treated: Yes     HDL Cholesterol: 88.6 mg/dL     Total Cholesterol: 242 mg/dL

## 2024-01-31 ENCOUNTER — Encounter: Payer: Self-pay | Admitting: Podiatry

## 2024-01-31 ENCOUNTER — Ambulatory Visit: Admitting: Podiatry

## 2024-01-31 ENCOUNTER — Ambulatory Visit (INDEPENDENT_AMBULATORY_CARE_PROVIDER_SITE_OTHER)

## 2024-01-31 DIAGNOSIS — M7752 Other enthesopathy of left foot: Secondary | ICD-10-CM

## 2024-01-31 DIAGNOSIS — M2012 Hallux valgus (acquired), left foot: Secondary | ICD-10-CM

## 2024-01-31 DIAGNOSIS — M2011 Hallux valgus (acquired), right foot: Secondary | ICD-10-CM | POA: Diagnosis not present

## 2024-01-31 NOTE — Progress Notes (Signed)
 Subjective:  Patient ID: Kathy  Riley, female    DOB: 05/21/1960,   MRN: [id number]  Chief Complaint  Patient presents with   Foot Pain    I have this bunion on my right foot.  On the left foot, I have scar tissue on the bottom.  I have no feeling in my second toe.  Wearing shoes is a difficulty.  I cannot walk barefooted.  I have to walk on the side of my foot.    63 y.o. female presents for concern of bilateral bunions.  She relates she has been dealing with the bunions for over 10 years.  She relates that the 1 on the left is worse most of the pain is around the second toe.  She relates numbness in the toe.  She relates tenderness along the ball of the foot.  She was told in the past that she had scar tissue in the area.  She denies any previous surgeries.  She also has bunion on the right but this 1 does not bother her.. Denies any other pedal complaints. Denies n/v/f/c.   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) 04/26/2023   Fibroid    GERD (gastroesophageal reflux disease)    Herpes zoster without complication 05/17/2016   Hyperlipidemia    03/17/17 TC 253, HDL 115 per review of notes   Hypertension    Irregular menses    Kidney stones    Mass of left breast 12/09/2017   h/o left breast lump at 10 o'clock position 2.5 cm in 2017 per patient biopsy was benign; reassessment due to left breast mass on Chest CT 12/06/17     Memory changes 01/01/2023   Mid back pain    Osteoporosis    Ovarian cyst    Pelvic floor relaxation 10/27/2021   Poison ivy dermatitis 06/20/2023   Polyp of colon  12/09/2017   UTI (urinary tract infection)     Objective:  Physical Exam: Vascular: DP/PT pulses 2/4 bilateral. CFT <3 seconds. Normal hair growth on digits. No edema.  Skin. No lacerations or abrasions bilateral feet.  Musculoskeletal: MMT 5/5 bilateral lower extremities in DF, PF, Inversion and Eversion. Deceased ROM in DF of ankle joint.  Bilateral HAV deformity noted.  Left worse than right.  No major tenderness to the medial eminence bilateral.  No pain with range of motion of the first MPJ.  Tender to the plantar second metatarsal phalangeal joint.  Tender with range of motion of the second digit.  Tender in the 1st and 2nd interspace as well. Neurological: Sensation intact to light touch.   Assessment:   1. Valgus deformity of both great toes   2. Capsulitis of toe of left foot      Plan:  Patient was evaluated and treated and all questions answered. -Xrays reviewed.  No acute fractures or dislocations noted.  Right foot HAV deformity noted with IM 1 to angle of about 13 degrees.  Sesamoid position of about 5.  Hammered digits 2 through 5.  Noted dorsiflexion of the first metatarsal and pes cavus.  Let left foot HAV deformity noted with IM 1 to angle of about 16 degrees sesamoid position of 6.  Hammer digits 2 through 5.  Pes cavus and mild dorsiflexion of the first metatarsal.  Minimal degenerative changes noted bilateral. Discussed capsulitis and inflammation of joint and treatment options with patient.  Discussed stiff sole shoes and recommend use of metatarsal padding.  For 2 weeks straight. Discussed if pain does not improve may consider injection, PT and/or MRI for further surgical planning.  -Discussed HAV and treatment options;conservative and surgical management; risks, benefits, alternatives discussed. All patient's questions answered. -Discussed padding and wide shoe gear.   -Recommend continue with good supportive shoes and inserts.  -Discussed surgical options.  [place]  bunionectomy versus Lapidus versus MIS bunion surgery. -Patient to return to office in 6 weeks for recheck   Asberry Failing, DPM

## 2024-03-20 ENCOUNTER — Encounter: Payer: Self-pay | Admitting: Podiatry

## 2024-03-20 ENCOUNTER — Ambulatory Visit (INDEPENDENT_AMBULATORY_CARE_PROVIDER_SITE_OTHER): Admitting: Podiatry

## 2024-03-20 DIAGNOSIS — M2012 Hallux valgus (acquired), left foot: Secondary | ICD-10-CM

## 2024-03-20 DIAGNOSIS — M2042 Other hammer toe(s) (acquired), left foot: Secondary | ICD-10-CM

## 2024-03-20 DIAGNOSIS — M7752 Other enthesopathy of left foot: Secondary | ICD-10-CM | POA: Diagnosis not present

## 2024-03-20 NOTE — Patient Instructions (Signed)

## 2024-03-20 NOTE — Progress Notes (Signed)
 "  Subjective:  Patient ID: Kathy Riley  Riley, female    DOB: 09/01/1960,   MRN: [id number]  Chief Complaint  Patient presents with   Bunions    Rm21 Follow up on bunions bilateral feet with no improvement from last visit.    63 y.o. female presents for follow-up of bilateral bunions and left foot pain.  She relates some improvement with the padding but has not been super consistent with that she relates this is still been a chronic problem and still nagging and bothering her daily.  Denies any other pedal complaints. Denies n/v/f/c.   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) 04/26/2023   Fibroid    GERD (gastroesophageal reflux disease)    Herpes zoster without complication 05/17/2016   Hyperlipidemia    03/17/17 TC 253, HDL 115 per review of notes   Hypertension    Irregular menses    Kidney stones    Mass of left breast 12/09/2017   h/o left breast lump at 10 o'clock position 2.5 cm in 2017 per patient biopsy was benign; reassessment due to left breast mass on Chest CT 12/06/17     Memory changes 01/01/2023   Mid back pain    Osteoporosis    Ovarian cyst    Pelvic floor relaxation 10/27/2021   Poison ivy dermatitis 06/20/2023   Polyp of colon 12/09/2017   UTI (urinary tract infection)     Objective:  Physical Exam: Vascular: DP/PT pulses 2/4 bilateral. CFT <3 seconds. Normal hair growth on digits. No edema.  Skin. No lacerations or abrasions bilateral feet.  Musculoskeletal: MMT 5/5 bilateral lower extremities in DF, PF, Inversion and Eversion. Deceased ROM in DF of ankle joint.   Bilateral HAV deformity noted.  Left worse than right.  No major tenderness to the medial eminence bilateral.  No pain with range of motion of the first MPJ.  Minimal first ray hypermobility.  Tender to the plantar second metatarsal phalangeal joint.  Tender with range of motion of the second digit.  Tender in the 1st and 2nd interspace as well.  Mildly hammered second digit Neurological: Sensation intact to light touch.   Assessment:   1. Hav (hallux abducto valgus), left   2. Capsulitis of toe of left foot   3. Hammertoe of left foot       Plan:  Patient was evaluated and treated and all questions answered. -Xrays reviewed.  No acute fractures or dislocations noted.  Right foot HAV deformity noted with IM 1 to angle of about 13 degrees.  Sesamoid position of about 5.  Hammered digits 2 through 5.  Noted dorsiflexion of the first metatarsal and pes cavus.  Let left foot HAV deformity noted with IM 1 to angle of about 16 degrees sesamoid position of 6.  Hammer digits 2 through 5.  Pes cavus and mild dorsiflexion of the first metatarsal.  Minimal degenerative changes noted bilateral. Discussed HAV and capsulitis and inflammation of joint and treatment options with patient.  Continue padding to alleviate  the symptoms. Discussed given minimal improvement would consider surgical intervention at this time.  Discussed perioperative course with patient.  Discussed MIS bunion surgery with hammertoe of the second digit repair as well as Weil osteotomy.  Discussed perioperative course.  Would suggest 2 weeks of nonweightbearing postoperatively. -Informed surgical risk consent was reviewed and read aloud to the patient.  I reviewed the films.  I have discussed my findings with the patient in great detail.  I have discussed all risks including but not limited to infection, stiffness, scarring, limp, disability, deformity, damage to blood vessels and nerves, numbness, poor healing, need for braces, arthritis,  chronic pain, amputation, death.  All benefits and realistic expectations discussed in great detail.  I have made no promises as to the outcome.  I have provided realistic expectations.  I have offered the patient a 2nd opinion, which they have declined and assured me they preferred to proceed despite the risks. Plan for surgery in the coming months. Postop meds: Zofran, oxycodone 5-325 mg, aspirin  twice daily, Keflex  [name], DPM    "

## 2024-03-28 ENCOUNTER — Telehealth: Payer: Self-pay | Admitting: Podiatry

## 2024-03-28 NOTE — Telephone Encounter (Signed)
 Called and patient is scheduled for surgery on 06/19/2024 per her request. Patient not on any GLP1 or blood thinners. Pharmacy correct in chart.

## 2024-04-03 ENCOUNTER — Other Ambulatory Visit: Payer: Self-pay

## 2024-04-03 DIAGNOSIS — M4316 Spondylolisthesis, lumbar region: Secondary | ICD-10-CM

## 2024-04-26 ENCOUNTER — Telehealth: Payer: Self-pay | Admitting: Podiatry

## 2024-04-26 NOTE — Telephone Encounter (Signed)
 DOS- 06/19/2024  DOUBLE OSTEOTOMY LT- 28299 METATARSAL OSTEOTOMY 2ND LT- 28308 HAMMERTOE REPAIR 2ND LT- 71714  [facility] EFFECTIVE DATE- 12/04/2022  DEDUCTIBLE- $2250 REMAINING- $2250 OOP- $4000 REMAINING- $4000 FAMILY DEDUCTIBLE- $6000 REMAINING- $6000 FAMILY OOP- $11000 REMAINING- $11000 COINSURANCE- 30%  PER [facility] PORTAL, PRIOR AUTH IS NOT REQUIRED FOR CPT CODES 71700, [id number], AND 71714. DECISION ID# [id number]

## 2024-06-26 ENCOUNTER — Encounter: Admitting: Podiatry

## 2024-07-10 ENCOUNTER — Encounter: Admitting: Podiatry

## 2024-07-25 ENCOUNTER — Ambulatory Visit
